# Patient Record
Sex: Female | Born: 1960 | Race: White | Hispanic: No | Marital: Single | Smoking: Former smoker
Health system: Southern US, Community
[De-identification: ages and names within clinical notes are randomized; demographics above are authoritative.]

## PROBLEM LIST (undated history)

## (undated) DIAGNOSIS — G35 Multiple sclerosis: Secondary | ICD-10-CM

## (undated) DIAGNOSIS — M419 Scoliosis, unspecified: Secondary | ICD-10-CM

## (undated) DIAGNOSIS — G35D Multiple sclerosis, unspecified: Secondary | ICD-10-CM

## (undated) DIAGNOSIS — L409 Psoriasis, unspecified: Secondary | ICD-10-CM

## (undated) HISTORY — DX: Scoliosis, unspecified: M41.9

## (undated) HISTORY — PX: THYROID SURGERY: SHX805

## (undated) HISTORY — PX: FINGER GANGLION CYST EXCISION: SHX1636

## (undated) HISTORY — PX: AUGMENTATION MAMMAPLASTY: SUR837

## (undated) HISTORY — PX: REPLACEMENT TOTAL KNEE: SUR1224

## (undated) HISTORY — DX: Psoriasis, unspecified: L40.9

## (undated) HISTORY — DX: Multiple sclerosis: G35

## (undated) HISTORY — PX: FOOT SURGERY: SHX648

## (undated) HISTORY — DX: Multiple sclerosis, unspecified: G35.D

---
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
## 1994-06-02 HISTORY — PX: REFRACTIVE SURGERY: SHX103

## 2006-07-05 HISTORY — PX: OTHER SURGICAL HISTORY: SHX169

## 2014-11-16 ENCOUNTER — Encounter: Payer: Self-pay | Admitting: Neurology

## 2015-06-28 HISTORY — PX: FINGER SURGERY: SHX640

## 2017-04-19 HISTORY — PX: HEMORRHOIDECTOMY WITH HEMORRHOID BANDING: SHX5633

## 2017-04-27 HISTORY — PX: LUMBAR EPIDURAL INJECTION: SHX1980

## 2018-05-07 ENCOUNTER — Telehealth: Payer: Self-pay | Admitting: *Deleted

## 2018-05-07 ENCOUNTER — Ambulatory Visit (INDEPENDENT_AMBULATORY_CARE_PROVIDER_SITE_OTHER): Payer: 59 | Admitting: Neurology

## 2018-05-07 ENCOUNTER — Encounter: Payer: Self-pay | Admitting: Neurology

## 2018-05-07 VITALS — BP 119/71 | HR 64 | Ht 67.0 in | Wt 196.5 lb

## 2018-05-07 DIAGNOSIS — R269 Unspecified abnormalities of gait and mobility: Secondary | ICD-10-CM

## 2018-05-07 DIAGNOSIS — Z79899 Other long term (current) drug therapy: Secondary | ICD-10-CM | POA: Diagnosis not present

## 2018-05-07 DIAGNOSIS — G35 Multiple sclerosis: Secondary | ICD-10-CM | POA: Diagnosis not present

## 2018-05-07 DIAGNOSIS — R5383 Other fatigue: Secondary | ICD-10-CM

## 2018-05-07 DIAGNOSIS — M21372 Foot drop, left foot: Secondary | ICD-10-CM | POA: Diagnosis not present

## 2018-05-07 DIAGNOSIS — G8111 Spastic hemiplegia affecting right dominant side: Secondary | ICD-10-CM

## 2018-05-07 MED ORDER — ARMODAFINIL 200 MG PO TABS: ORAL_TABLET | ORAL | 1 refills | Status: DC

## 2018-05-07 MED ORDER — TIZANIDINE HCL 4 MG PO CAPS: ORAL_CAPSULE | ORAL | 3 refills | Status: DC

## 2018-05-07 MED ORDER — BACLOFEN 20 MG PO TABS: 20.0000 mg | ORAL_TABLET | Freq: Four times a day (QID) | ORAL | 3 refills | Status: DC

## 2018-05-07 NOTE — Progress Notes (Signed)
[facility]  PATIENT: Meredith Hale DOB: 21-Sep-1960  REFERRING DOCTOR OR PCP: Dr. Merita Norton [facility] neurology) SOURCE: Patient, notes from Dr. Manson Passey, imaging and lab reports   _________________________________   HISTORICAL  CHIEF COMPLAINT:  Chief Complaint  Patient presents with  . New Patient (Initial Visit)    RM 13, alone. Paper referral from Dr. Merita Norton for MS evaluation. She recently moved here with her significant other from [place].   . Eye Problem    Left eye worse than right eye. R 20/20, L 20/40, B20/20  . Multiple Sclerosis    Dx in 2006. Last Ocrevus infusion 11/30/17. Has tried failed: Betaseron (SE-felt like a "train wreck", bones ached), copaxone, tecfidera (tolerated with minimal SE, lymphocyte count dropped and neurologist changed her to Aubagio), Aubagio (tolerated well but switched to Ocrevus per preference).   . Gait Problem    Ambulates with cane in public but not in the house. No falls in last year. She has had falls in the past d/t weakness in legs and fatigue.    HISTORY OF PRESENT ILLNESS:  I had the pleasure of seeing your patient, Meredith Hale, at the MS center at [facility] for neurologic consultation regarding her multiple sclerosis.   She is currently on Ocrevus therapy and had her last infusion 11/30/2017.  She is a 57 year old woman who was diagnosed with MS in 2006.  She was mowing the lawn on a hot day when she felt weak and dizzy.   The next morning she woke up numb form the waist down.   When she did not improve, she saw her doctor.   She had MRIs and a lumbar puncture.   The MRI showed lesions consistent with MS,   She had oligoclonal bands in her CSF.   Initially, she was placed on Betaseron but had side effects causing body aches.  Then she was placed on Copaxone and switched to Tecfidera after having an exacerbation.  She tolerated Tecfidera well but her lymphocyte count dropped and she was switched  to Aubagio.  More recently she switched from Aubagio to Ocrevus to be on a more effective medication.   She tolerates it well and her last infusion was Nov 30, 2017.   Her first infusion was November 2018.      Currently, she has weakness on her left side, leg and arm.   She has left foot drop and has trouble lifting the left leg when she walks.    She also has reduced ability to write (she is left handed)    She also has scoliosis and has had issues with her right foot.   Balance is also off.     She uses a cane.   She has muscle spasticity, worse at night and helped by baclofen 20 mg po qid.    Even on the high dose baclofen she notes spasticity.   She does not feel too sleepy.     Bladder function is fine.    She notes ligh sensitivity.   She never had optic neuritis or diplopia.  She notes a lot of fatigue, much worse in heat, physical and cognitve.   She recently moved from [place] and did better there than here.  She has been on Nuvigil in the past, when she worked, and found a benefit.  She sleeps well most nights but is going through menopause.    She notes occasional forgetfulness but no major problems with cognition.    Mood  is doing well for the most part.      She used to work as a Air cabin crew.       Her last MRI was January 2018.    The brian showed white matter foci, mostly non-specific, but the cervical spinal cord showed at least 5 foci c/w demyelination.     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    She has scoliosis causing some back pain and has been told it has worsened.       REVIEW OF SYSTEMS: Constitutional: No fevers, chills, sweats, or change in appetite.  She has fatigue. Eyes: No visual changes, double vision, eye pain.  There is light sensitivity. Ear, nose and throat: No hearing loss, ear pain, nasal congestion, sore throat.  She has tinnitus. Cardiovascular: No chest pain, palpitations Respiratory: No shortness of breath at rest or with  exertion.   No wheezes GastrointestinaI: No nausea, vomiting, diarrhea, abdominal pain, fecal incontinence Genitourinary: No dysuria, urinary retention or frequency.  No nocturia. Musculoskeletal: No neck pain, back pain.   She has scoliosis.   Integumentary: No rash, pruritus currently though she does have psoriasis and some skin moles Neurological: as above Psychiatric: No depression at this time.  No anxiety Endocrine: No palpitations, diaphoresis, change in appetite, change in weigh or increased thirst Hematologic/Lymphatic: No anemia, purpura, petechiae. Allergic/Immunologic: No itchy/runny eyes, nasal congestion, recent allergic reactions, rashes  ALLERGIES: No Known Allergies  HOME MEDICATIONS:  Current Outpatient Medications:  .  baclofen (LIORESAL) 20 MG tablet, Take 1 tablet (20 mg total) by mouth 4 (four) times daily., Disp: 360 each, Rfl: 3 .  fluocinonide (LIDEX) 0.05 % external solution, Apply 1 application topically as needed., Disp: , Rfl:  .  naproxen (NAPROSYN) 500 MG tablet, Take 500 mg by mouth as needed., Disp: , Rfl:  .  ocrelizumab 600 mg in sodium chloride 0.9 % 500 mL, Inject 600 mg into the vein every 6 (six) months., Disp: , Rfl:  .  Armodafinil 200 MG TABS, One po qAM, Disp: 90 tablet, Rfl: 1 .  tiZANidine (ZANAFLEX) 4 MG capsule, Take 1/2 to 2 pills nightly, Disp: 180 capsule, Rfl: 3  PAST MEDICAL HISTORY: Past Medical History:  Diagnosis Date  . Multiple sclerosis (HCC)   . Psoriasis   . Scoliosis     PAST SURGICAL HISTORY: Past Surgical History:  Procedure Laterality Date  . FOOT SURGERY     right and left foot    FAMILY HISTORY: Family History  Adopted: Yes  Family history unknown: Yes    SOCIAL HISTORY:  Social History   Socioeconomic History  . Marital status: Single    Spouse name: Not on file  . Number of children: 0  . Years of education: Masters  . Highest education level: Not on file  Occupational History  . Occupation:  unemployed  Social Needs  . Financial resource strain: Not on file  . Food insecurity:    Worry: Not on file    Inability: Not on file  . Transportation needs:    Medical: Not on file    Non-medical: Not on file  Tobacco Use  . Smoking status: Current Every Day Smoker    Packs/day: 1.00  . Smokeless tobacco: Never Used  Substance and Sexual Activity  . Alcohol use: Yes    Comment: 1 per week  . Drug use: Never  . Sexual activity: Not on file  Lifestyle  . Physical activity:  Days per week: Not on file    Minutes per session: Not on file  . Stress: Not on file  Relationships  . Social connections:    Talks on phone: Not on file    Gets together: Not on file    Attends religious service: Not on file    Active member of club or organization: Not on file    Attends meetings of clubs or organizations: Not on file    Relationship status: Not on file  . Intimate partner violence:    Fear of current or ex partner: Not on file    Emotionally abused: Not on file    Physically abused: Not on file    Forced sexual activity: Not on file  Other Topics Concern  . Not on file  Social History Narrative   Lives w/ significant other, Meredith Hale   Caffeine use: 2 cups per day   Left handed but transitioning to be right handed d/t being weak in left hand     PHYSICAL EXAM  Vitals:   05/07/18 1329  BP: 119/71  Pulse: 64  Weight: 196 lb 8 oz (89.1 kg)  Height: 5\' 7"  (1.702 m)    Body mass index is 30.78 kg/m.   General: The patient is well-developed and well-nourished and in no acute distress  Eyes:  Funduscopic exam shows normal optic discs and retinal vessels.  Neck: The neck is supple, no carotid bruits are noted.  The neck is nontender.  Cardiovascular: The heart has a regular rate and rhythm with a normal S1 and S2. There were no murmurs, gallops or rubs. Lungs are clear to auscultation.  Skin: Extremities are without significant edema.  Musculoskeletal:  Back  is nontender  Neurologic Exam  Mental status: The patient is alert and oriented x 3 at the time of the examination. The patient has apparent normal recent and remote memory, with an apparently normal attention span and concentration ability.   Speech is normal.  Cranial nerves: Extraocular movements are full. Pupils are equal, round, and reactive to light and accomodation.  Visual fields are full.  Facial symmetry is present. There is good facial sensation to soft touch bilaterally.Facial strength is normal.  Trapezius and sternocleidomastoid strength is normal. No dysarthria is noted.  The tongue is midline, and the patient has symmetric elevation of the soft palate. No obvious hearing deficits are noted.  Motor: Muscle bulk is normal.  There is some increased muscle tone in the left leg.. Strength is  5 / 5 on the right.  Strength is 4+ to 5/5 in the left arm but rapid alternating movements are performed slowly.  Strength is 2/5 in the left iliopsoas, 4/5 in the left quadriceps, 4-/5 in the left hamstrings, 2-/5 in the left foot and ankle.  Sensory: Sensory testing is intact to pinprick, soft touch and vibration sensation in all 4 extremities.  Coordination: Cerebellar testing reveals good finger-nose-finger and heel-to-shin bilaterally.  Gait and station: Station is normal.   She has a severe left foot drop and has to circumnavigate the left leg due to more proximal weakness.  She is unable to do a tandem walk.  The Romberg is borderline..   Reflexes: Deep tendon reflexes are symmetric and increased on the left relative to the right.   Plantar responses are flexor on the right and extensor on the left.    DIAGNOSTIC DATA (LABS, IMAGING, TESTING) - I reviewed patient records, labs, notes, testing and imaging myself where available.  ASSESSMENT AND PLAN  Multiple sclerosis (HCC) - Plan: MR BRAIN W WO CONTRAST, MR CERVICAL SPINE W WO CONTRAST, IgG, IgA, IgM, CBC with  Differential/Platelet, Hepatitis B Core AB, Total  High risk medication use - Plan: IgG, IgA, IgM, CBC with Differential/Platelet, Hepatitis B Core AB, Total  Left foot drop  Gait disturbance  Other fatigue  Right spastic hemiplegia (HCC)   In summary, Maylyn better is a 58 year old woman with multiple sclerosis diagnosed in 2006, currently on Ocrevus as her disease modifying therapy.  Her main problems are right sided spastic hemiplegia and fatigue.  She has moved to this area and we will need to get her approved for Ocrevus therapy locally.  Her next dose should be in about a month.  Reviewing labs from [place], she had hep B surface antigen and hep B surface antibody and TB tests which were fine.  We will check a hep B core antibody.   I will also check IgG/IgM and CBC.  We also need to check an MRI of the brain and cervical spine to determine if she is having any subclinical progression while on Ocrevus and consider a different therapy if this is occurring.  We will get her old MRIs for comparison.  She will continue Nuvigil for her fatigue and I will send in a refill.  Additionally I will start tizanidine at bedtime to help with her nocturnal painful spasticity that affects her sleep.    Due to the combination of her physical impairments and her fatigue, she will have difficulty working.  She will return to see me in 5 to 6 months for regular visit with me but call sooner if there are new or worsening neurologic symptoms.  We will call her with the results of the studies and she will return to the office in about a month for her next infusion.   Cassian Torelli A. Epimenio Foot, MD, PhD, FAAN Certified in Neurology, Clinical Neurophysiology, Sleep Medicine, Pain Medicine and Neuroimaging Director, Multiple Sclerosis Center at [facility]  [facility] [address] [place], [place] [address] [phone]

## 2018-05-07 NOTE — Telephone Encounter (Signed)
Gave completed/signed Ocrevus start form to [facility] here at [facility] to process for pt.

## 2018-05-08 ENCOUNTER — Telehealth: Payer: Self-pay | Admitting: *Deleted

## 2018-05-08 ENCOUNTER — Telehealth: Payer: Self-pay | Admitting: Neurology

## 2018-05-08 LAB — CBC WITH DIFFERENTIAL/PLATELET
BASOS ABS: 0.1 10*3/uL (ref 0.0–0.2)
Basos: 1 %
EOS (ABSOLUTE): 0.1 10*3/uL (ref 0.0–0.4)
Eos: 2 %
HEMOGLOBIN: 12.6 g/dL (ref 11.1–15.9)
Hematocrit: 38 % (ref 34.0–46.6)
IMMATURE GRANS (ABS): 0 10*3/uL (ref 0.0–0.1)
IMMATURE GRANULOCYTES: 0 %
LYMPHS: 8 %
Lymphocytes Absolute: 0.6 10*3/uL — ABNORMAL LOW (ref 0.7–3.1)
MCH: 29.9 pg (ref 26.6–33.0)
MCHC: 33.2 g/dL (ref 31.5–35.7)
MCV: 90 fL (ref 79–97)
MONOCYTES: 12 %
Monocytes Absolute: 0.9 10*3/uL (ref 0.1–0.9)
NEUTROS PCT: 77 %
Neutrophils Absolute: 5.5 10*3/uL (ref 1.4–7.0)
Platelets: 299 10*3/uL (ref 150–450)
RBC: 4.22 x10E6/uL (ref 3.77–5.28)
RDW: 12.6 % (ref 12.3–15.4)
WBC: 7.2 10*3/uL (ref 3.4–10.8)

## 2018-05-08 LAB — IGG, IGA, IGM
IGM (IMMUNOGLOBULIN M), SRM: 102 mg/dL (ref 26–217)
IgA/Immunoglobulin A, Serum: 327 mg/dL (ref 87–352)
IgG (Immunoglobin G), Serum: 1033 mg/dL (ref 700–1600)

## 2018-05-08 LAB — HEPATITIS B CORE ANTIBODY, TOTAL: Hep B Core Total Ab: NEGATIVE

## 2018-05-08 MED ORDER — TIZANIDINE HCL 4 MG PO TABS: ORAL_TABLET | ORAL | 3 refills | Status: DC

## 2018-05-08 NOTE — Telephone Encounter (Signed)
Received form from [facility] asking for PA armodafinil. Completed and waiting on MD signature. Will then fax back.

## 2018-05-08 NOTE — Telephone Encounter (Signed)
Tizanidine rx. changed from capsules to tablets due to cost.  No other changes to rx. made. Escribed to [facility]/fim

## 2018-05-08 NOTE — Telephone Encounter (Signed)
lvm for pt to call back about scheduling mri  °[employer] auth: NPR via [employer] website  °

## 2018-05-09 NOTE — Telephone Encounter (Signed)
Faxed completed PA to [facility]. Waiting on determination.

## 2018-05-10 ENCOUNTER — Telehealth: Payer: Self-pay | Admitting: *Deleted

## 2018-05-10 MED ORDER — TIZANIDINE HCL 4 MG PO TABS: ORAL_TABLET | ORAL | 3 refills | Status: DC

## 2018-05-10 NOTE — Telephone Encounter (Signed)
New Tizanidine rx. escribed to [facility] to clarify rx instructions for Tizanidine.  Instructions changed from take 1/2-2 capsules at hs, to take 1-2 capsules at hs/fim

## 2018-05-13 ENCOUNTER — Telehealth: Payer: Self-pay | Admitting: *Deleted

## 2018-05-13 MED ORDER — MODAFINIL 200 MG PO TABS: 200.0000 mg | ORAL_TABLET | Freq: Every day | ORAL | 1 refills | Status: DC

## 2018-05-13 NOTE — Telephone Encounter (Signed)
-----   Message from Hillis Range, RN sent at 05/13/2018  8:09 AM EDT -----   ----- Message ----- From: Asa Lente, MD Sent: 05/10/2018   2:30 PM EDT To: Magnus Sinning Misenheimer, RN  Please let her know that the lab work looks okay.  Her lymphocytes are mildly reduced but the antibody levels are in the normal range.

## 2018-05-13 NOTE — Telephone Encounter (Signed)
Pt called office back, I took call from phone staff. She received my message. She would like to try to see if insurance will cover modafinil instead. Advised I will ask Dr. Epimenio Foot. I will keep her updated. She verbalized understanding.  She also wants update about getting her ocrevus infusions scheduled. Advised [facility] given her start form last week. Can take a couple weeks or so to get approval from insurance. I will give them her message.

## 2018-05-13 NOTE — Telephone Encounter (Signed)
Called, LVM for pt about results. Gave [facility] phone number if she has further questions. 

## 2018-05-13 NOTE — Telephone Encounter (Signed)
Called, LVM for pt to call about armodafinil. Advised PA denied, insurance will only cover for 3 dx: shift work disorder, OSA, narcolepsy. She has option to use goodrx coupon. We can sent to [facility] and it would be about 70.00. Asked her to call back and discuss.

## 2018-05-13 NOTE — Telephone Encounter (Signed)
Spoke with Dr. Epimenio Foot- he is ok to switch her from armodafinil to modafinil 200mg  tab #90, 1 refill to see if insurance will cover this.

## 2018-05-14 NOTE — Telephone Encounter (Signed)
Fax received from [facility]. Modafinil denied b/c pt. has not tried Amantadine for her MS related fatigue./fim

## 2018-05-14 NOTE — Telephone Encounter (Signed)
Called, LVM for pt asking her to call back tomorrow to discuss.  Advised modafinil not covered. Insurance requiring she try amantadine first. Dr. Epimenio Foot can call in rx for her: amantadine 100mg  BID #60, 5 refills if she is agreeable to this.

## 2018-05-14 NOTE — Telephone Encounter (Addendum)
Submitted PA modafinil on covermymeds. Key: [id number]. Waiting on determination.  "Your information has been submitted to [facility]. To check for an updated outcome later, reopen this PA request from your dashboard. If [facility] has not responded to your request within 24 hours, contact [facility] at [phone]."

## 2018-05-20 NOTE — Telephone Encounter (Signed)
I called patient back. She is working with her insurance to try and get modafinil covered. She would like to hold off switching rx to amantadine. She was advised on how to use goodrx.com to get coupon code to pay out of pocket for modafinil.   She also wants update on getting scheduled for Ocrevus infusion. She states she was approved via insurance. Advised I will check with [facility] and call back to give update. She verbalized understanding.

## 2018-05-20 NOTE — Telephone Encounter (Signed)
Called, LVM for pt to call office. 

## 2018-05-20 NOTE — Telephone Encounter (Signed)
Called, LVM for pt to let her know they are still working on getting her scheduled for Ocrevus infusions. They will call her once they receive an update. Asked her to call back if she has further questions/concerns.

## 2018-05-20 NOTE — Telephone Encounter (Signed)
Checked with [facility]--they are still processing her information.

## 2018-05-20 NOTE — Telephone Encounter (Signed)
Pt returning RN's call.

## 2018-05-29 NOTE — Telephone Encounter (Signed)
Pt called wanting to check on infusion process, RN advised that Tina/Beverly will f/u with the pt to schedule

## 2018-06-25 ENCOUNTER — Ambulatory Visit: Payer: 59

## 2018-06-25 ENCOUNTER — Other Ambulatory Visit: Payer: 59

## 2018-06-25 DIAGNOSIS — G35 Multiple sclerosis: Secondary | ICD-10-CM | POA: Diagnosis not present

## 2018-06-25 MED ORDER — GADOBENATE DIMEGLUMINE 529 MG/ML IV SOLN
14.0000 mL | Freq: Once | INTRAVENOUS | Status: AC | PRN
  Administered 2018-06-25: 14 mL via INTRAVENOUS

## 2018-06-28 ENCOUNTER — Telehealth: Payer: Self-pay | Admitting: *Deleted

## 2018-06-28 NOTE — Telephone Encounter (Signed)
-----   Message from Asa Lente, MD sent at 06/28/2018 12:40 PM EST ----- Please let him know that the MRI of the cervical spine shows several MS plaques.  None of them look to be new or recent.  The MRI of the brain just shows a couple small spots that are more nonspecific.

## 2018-06-28 NOTE — Telephone Encounter (Signed)
Left patient a detailed message, with results, on voicemail (ok per DPR).  Provided our number to call back with any questions.  

## 2018-07-03 ENCOUNTER — Other Ambulatory Visit: Payer: Self-pay | Admitting: Neurology

## 2018-07-03 NOTE — Telephone Encounter (Signed)
I printed this message and gave it to Christoval in the infusion suite.  I am not sure where [facility] gets pt's Ocrevus, but will order it from [facility] if [facility] needs [place] too.  Otherwise, [facility] will order it/fim

## 2018-07-03 NOTE — Telephone Encounter (Signed)
Marianna Fuss with [facility] called stating they are needing a new rx for Ocrevus sent over. Please advise

## 2018-08-16 ENCOUNTER — Encounter: Payer: Self-pay | Admitting: Podiatry

## 2018-08-16 ENCOUNTER — Ambulatory Visit (INDEPENDENT_AMBULATORY_CARE_PROVIDER_SITE_OTHER): Payer: 59 | Admitting: Podiatry

## 2018-08-16 ENCOUNTER — Ambulatory Visit (INDEPENDENT_AMBULATORY_CARE_PROVIDER_SITE_OTHER): Payer: 59

## 2018-08-16 VITALS — BP 115/66 | HR 85 | Resp 16

## 2018-08-16 DIAGNOSIS — M779 Enthesopathy, unspecified: Secondary | ICD-10-CM | POA: Diagnosis not present

## 2018-08-16 DIAGNOSIS — Z01419 Encounter for gynecological examination (general) (routine) without abnormal findings: Secondary | ICD-10-CM | POA: Insufficient documentation

## 2018-08-16 DIAGNOSIS — M7750 Other enthesopathy of unspecified foot: Secondary | ICD-10-CM

## 2018-08-16 DIAGNOSIS — M722 Plantar fascial fibromatosis: Secondary | ICD-10-CM | POA: Diagnosis not present

## 2018-08-16 DIAGNOSIS — M7751 Other enthesopathy of right foot: Secondary | ICD-10-CM | POA: Diagnosis not present

## 2018-08-16 MED ORDER — TRIAMCINOLONE ACETONIDE 10 MG/ML IJ SUSP
10.0000 mg | Freq: Once | INTRAMUSCULAR | Status: AC
  Administered 2018-08-16: 10 mg

## 2018-08-16 NOTE — Progress Notes (Signed)
   Subjective:    Patient ID: Meredith Hale, female    DOB: 1960-09-27, 58 y.o.   MRN: [id number]  HPI    Review of Systems  All other systems reviewed and are negative.      Objective:   Physical Exam        Assessment & Plan:

## 2018-08-16 NOTE — Progress Notes (Signed)
Subjective:   Patient ID: Meredith Hale, female   DOB: 58 y.o.   MRN: [id number]   HPI Patient presents stating she is had foot surgery on both feet with ankle surgery in 2007 right and foot surgery afterwards 2012.  Patient has MS which is contributing factor and is developed foot drop left and severe flatfoot deformity right and does wear bracing that is older and probably needs to be replaced.  Patient is not walking properly and does utilize a cane and has most of her pain in the outside of the right ankle now.  Patient smokes a pack per day and would like to be more active   Review of Systems  All other systems reviewed and are negative.       Objective:  Physical Exam Vitals signs and nursing note reviewed.  Constitutional:      Appearance: She is well-developed.  Pulmonary:     Effort: Pulmonary effort is normal.  Musculoskeletal: Normal range of motion.  Skin:    General: Skin is warm.  Neurological:     Mental Status: She is alert.     Neurovascular status intact muscle strength is adequate range of motion is within normal limits.  Patient does have severe flatfoot deformity right with dysfunction of posterior tibial tendon indications of talonavicular pathology and exquisite discomfort in the sinus tarsi.  Left shows complete lack of function of the extensor tendon group and also the anterior tibial tendon and patient does have brace which is moderately successful in helping her symptoms.  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  Today I met a focus on the acute inflammation I injected the sinus tarsi right 3 mg Kenalog 5 mg Xylocaine and advised on bracing and I am going to have her see  ped orthotist for consideration of replacing her old braces with newer technology which may be of better benefit to her.  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

## 2018-08-17 ENCOUNTER — Ambulatory Visit: Payer: 59 | Admitting: Podiatry

## 2018-08-19 ENCOUNTER — Ambulatory Visit (INDEPENDENT_AMBULATORY_CARE_PROVIDER_SITE_OTHER): Payer: 59 | Admitting: Orthotics

## 2018-08-19 DIAGNOSIS — M779 Enthesopathy, unspecified: Secondary | ICD-10-CM | POA: Diagnosis not present

## 2018-08-19 DIAGNOSIS — M722 Plantar fascial fibromatosis: Secondary | ICD-10-CM

## 2018-08-19 DIAGNOSIS — M7750 Other enthesopathy of unspecified foot: Secondary | ICD-10-CM

## 2018-08-19 NOTE — Progress Notes (Signed)
Patient presents with MS, tendonitis, PTTD, RF valgus deformity, sinus tarsi syn.   Upon examination, patient is about 5* EVERTED on the RF and is a lot of discomfort, particularly in sinus tarsi region.   Showed her different brace options: mezzo, richie, az gauntlet, plastic afo.   Due to financial considerations, as well as wanting a low profile brace, she really would like to try a Richie inverted type of foot orthotic.  Cast her for [id number]

## 2018-09-05 ENCOUNTER — Telehealth: Payer: Self-pay | Admitting: Neurology

## 2018-09-05 MED ORDER — BACLOFEN 20 MG PO TABS: 20.0000 mg | ORAL_TABLET | Freq: Four times a day (QID) | ORAL | 3 refills | Status: DC

## 2018-09-05 NOTE — Telephone Encounter (Signed)
E-scribed rx to [facility] as requested.

## 2018-09-05 NOTE — Telephone Encounter (Signed)
Pt is needing her 90 day supply of her baclofen (LIORESAL) 20 MG tablet sent to [facility] [phone] Pt states she will use GoodRx and not the insurance because it would be half the cost for her. Please advise.

## 2018-09-06 ENCOUNTER — Ambulatory Visit (INDEPENDENT_AMBULATORY_CARE_PROVIDER_SITE_OTHER): Payer: 59 | Admitting: Orthotics

## 2018-09-06 DIAGNOSIS — M779 Enthesopathy, unspecified: Secondary | ICD-10-CM

## 2018-09-06 DIAGNOSIS — M722 Plantar fascial fibromatosis: Secondary | ICD-10-CM | POA: Diagnosis not present

## 2018-09-06 DIAGNOSIS — M7750 Other enthesopathy of unspecified foot: Secondary | ICD-10-CM

## 2018-09-06 NOTE — Progress Notes (Addendum)
Patient picked up f/o; however, adjustments needed to be made to address her serious RF valgus deformity.  More varus posting was added to both RF and FF.  Patient was advised that IF this wasn't going to be enough; then we need to remake the f/o to more of a UCBL type device OR Az mezzo brace.  On the LEFT foot she has an Ottobock OTS brace; she is trying a Trulife brace with anterior interface; she seems to be having more stability and lift with this brace; she will decide if she wants me to order her one.   Patient wants the Trulife brace.  Delivered.

## 2018-10-01 ENCOUNTER — Other Ambulatory Visit: Payer: 59 | Admitting: Orthotics

## 2018-10-08 ENCOUNTER — Other Ambulatory Visit: Payer: 59 | Admitting: Orthotics

## 2018-11-06 ENCOUNTER — Ambulatory Visit: Payer: 59 | Admitting: Neurology

## 2018-11-26 ENCOUNTER — Telehealth: Payer: Self-pay | Admitting: Osteopathic Medicine

## 2018-11-26 ENCOUNTER — Other Ambulatory Visit: Payer: Self-pay | Admitting: *Deleted

## 2018-11-26 DIAGNOSIS — Z1231 Encounter for screening mammogram for malignant neoplasm of breast: Secondary | ICD-10-CM

## 2018-11-26 NOTE — Telephone Encounter (Signed)
Placed new patient paperwork in the mail for patient. 11/26/2018.

## 2018-12-05 ENCOUNTER — Ambulatory Visit (INDEPENDENT_AMBULATORY_CARE_PROVIDER_SITE_OTHER): Payer: 59

## 2018-12-05 ENCOUNTER — Other Ambulatory Visit: Payer: Self-pay

## 2018-12-05 DIAGNOSIS — Z1231 Encounter for screening mammogram for malignant neoplasm of breast: Secondary | ICD-10-CM | POA: Diagnosis not present

## 2018-12-11 ENCOUNTER — Ambulatory Visit: Payer: 59 | Admitting: Podiatry

## 2018-12-12 ENCOUNTER — Other Ambulatory Visit: Payer: Self-pay

## 2018-12-12 ENCOUNTER — Ambulatory Visit (INDEPENDENT_AMBULATORY_CARE_PROVIDER_SITE_OTHER): Payer: 59 | Admitting: Osteopathic Medicine

## 2018-12-12 ENCOUNTER — Encounter: Payer: Self-pay | Admitting: Osteopathic Medicine

## 2018-12-12 ENCOUNTER — Ambulatory Visit (INDEPENDENT_AMBULATORY_CARE_PROVIDER_SITE_OTHER): Payer: 59 | Admitting: Podiatry

## 2018-12-12 ENCOUNTER — Encounter: Payer: Self-pay | Admitting: Podiatry

## 2018-12-12 VITALS — Temp 97.9°F

## 2018-12-12 VITALS — BP 131/74 | HR 75 | Temp 98.1°F | Ht 67.0 in | Wt 213.3 lb

## 2018-12-12 DIAGNOSIS — M779 Enthesopathy, unspecified: Secondary | ICD-10-CM

## 2018-12-12 DIAGNOSIS — G35 Multiple sclerosis: Secondary | ICD-10-CM

## 2018-12-12 DIAGNOSIS — Z975 Presence of (intrauterine) contraceptive device: Secondary | ICD-10-CM | POA: Diagnosis not present

## 2018-12-12 DIAGNOSIS — L409 Psoriasis, unspecified: Secondary | ICD-10-CM | POA: Diagnosis not present

## 2018-12-12 DIAGNOSIS — M7751 Other enthesopathy of right foot: Secondary | ICD-10-CM | POA: Diagnosis not present

## 2018-12-12 DIAGNOSIS — M7750 Other enthesopathy of unspecified foot: Secondary | ICD-10-CM

## 2018-12-12 MED ORDER — TRIAMCINOLONE ACETONIDE 10 MG/ML IJ SUSP
10.0000 mg | Freq: Once | INTRAMUSCULAR | Status: AC
  Administered 2018-12-12: 10 mg

## 2018-12-12 NOTE — Progress Notes (Signed)
HPI: Meredith Hale is a 58 y.o. female who  has a past medical history of Multiple sclerosis (HCC), Psoriasis, and Scoliosis.  she presents to [facility] today, 12/12/18,  for chief complaint of: New to establish care   Very pleasant patient here to establish care.  Moved to the area a while ago from [place] with her significant other.  Multiple sclerosis: She is already established with a neurologist in the area.  No recent flares and patient is stable on current treatment.  GERD: Well-controlled on current treatment Psoriasis: Well-controlled on current treatment  New concern: Left ear fullness present for the past couple of weeks.  Patient brings printed materials outlining previous preventive care measures.  Colonoscopy April 04, 2017, advise repeat colonoscopy in 5 years for surveillance. Dr Dorathy Kinsman in [employer] [employer], [phone].  Mammogram May 2020 at [facility], results pending.  Pap smear April 16, 2017 was reportedly normal, Dr. Royden Purl in [facility]  Past medical, surgical, social and family history reviewed:  Patient Active Problem List   Diagnosis Date Noted  . Normal gynecologic examination 08/16/2018    Past Surgical History:  Procedure Laterality Date  . AUGMENTATION MAMMAPLASTY    . FOOT SURGERY     right and left foot    Social History   Tobacco Use  . Smoking status: Former Smoker    Packs/day: 1.00  . Smokeless tobacco: Never Used  Substance Use Topics  . Alcohol use: Yes    Comment: 1 per week    Family History  Adopted: Yes  Family history unknown: Yes     Current medication list and allergy/intolerance information reviewed:    Current Outpatient Medications  Medication Sig Dispense Refill  . baclofen (LIORESAL) 20 MG tablet Take 1 tablet (20 mg total) by mouth 4 (four) times daily. 360 each 3  . Biotin 1000 MCG tablet Take 1,000 mcg by mouth 3 (three) times daily.     Marland Kitchen CALCIUM-VITAMIN D PO Take by mouth.    . Cholecalciferol (D3-1000) 25 MCG (1000 UT) capsule Take 1,000 Units by mouth daily.    . clobetasol ointment (TEMOVATE) 0.05 % Apply 1 application topically 2 (two) times daily.    . Clobetasol Propionate (CLOBEX SPRAY) 0.05 % external spray Clobex 0.05 % topical spray    . fluocinonide (LIDEX) 0.05 % external solution Apply 1 application topically as needed.    . Multiple Vitamins-Minerals (MULTIVITAL) tablet Take 1 tablet by mouth daily.    . naproxen (NAPROSYN) 500 MG tablet Take 500 mg by mouth as needed.    Marland Kitchen ocrelizumab 600 mg in sodium chloride 0.9 % 500 mL Inject 600 mg into the vein every 6 (six) months.    . Omega-3 Fatty Acids (FISH OIL) 1000 MG CAPS Take by mouth.    Marland Kitchen omeprazole (PRILOSEC) 40 MG capsule Take 40 mg by mouth daily.    . Probiotic Product (ALIGN PO) Take by mouth.    Marland Kitchen tiZANidine (ZANAFLEX) 4 MG tablet Take 1 to 2 capsules nightly (Patient taking differently: 2 (two) times daily. Take 1 to 2 capsules nightly) 180 tablet 3   No current facility-administered medications for this visit.     No Known Allergies    Review of Systems:  Constitutional:  No  fever, no chills, No recent illness, No unintentional weight changes. No significant fatigue.   HEENT: No  headache, no vision change, no hearing change, No sore throat, No  sinus pressure  Cardiac:  No  chest pain, No  pressure, No palpitations, No  Orthopnea  Respiratory:  No  shortness of breath. No  Cough  Gastrointestinal: No  abdominal pain, No  nausea, No  vomiting,  No  blood in stool, No  diarrhea, No  constipation   Musculoskeletal: No new myalgia/arthralgia  Skin: No  Rash, No other wounds/concerning lesions  Genitourinary: No  incontinence, No  abnormal genital bleeding, No abnormal genital discharge  Hem/Onc: No  easy bruising/bleeding, No  abnormal lymph node  Endocrine: No cold intolerance,  No heat intolerance. No polyuria/polydipsia/polyphagia    Neurologic: +chronic L foot drop d/t MS, no other weakness, No  dizziness, No  slurred speech/focal weakness/facial droop  Psychiatric: No  concerns with depression, No  concerns with anxiety, No sleep problems, No mood problems  Exam:  BP 131/74 (BP Location: Left Arm, Patient Position: Sitting, Cuff Size: Large)   Pulse 75   Temp 98.1 F (36.7 C) (Oral)   Ht  (1.702 m)   Wt 213 lb 4.8 oz (96.8 kg)   LMP 09/06/2018   BMI 33.41 kg/m   Constitutional: VS see above. General Appearance: alert, well-developed, well-nourished, NAD  Eyes: Normal lids and conjunctive, non-icteric sclera  Ears, Nose, Mouth, Throat: MMM, Normal external inspection ears/nares/mouth/lips/gums. TM normal bilaterally. Pharynx/tonsils no erythema, no exudate. Nasal mucosa normal.   Neck: No masses, trachea midline. No thyroid enlargement. No tenderness/mass appreciated. No lymphadenopathy  Respiratory: Normal respiratory effort. no wheeze, no rhonchi, no rales  Cardiovascular: S1/S2 normal, no murmur, no rub/gallop auscultated. RRR. No lower extremity edema.   Gastrointestinal: Nontender, no masses. No hepatomegaly, no splenomegaly. No hernia appreciated. Bowel sounds normal. Rectal exam deferred.   Musculoskeletal: Gait normal. No clubbing/cyanosis of digits.   Neurological: Normal balance/coordination. No tremor.   Skin: warm, dry, intact. No rash/ulcer.  Psychiatric: Normal judgment/insight. Normal mood and affect. Oriented x3.      ASSESSMENT/PLAN: The primary encounter diagnosis was MS (multiple sclerosis) (HCC). Diagnoses of Psoriasis and IUD (intrauterine device) in place were also pertinent to this visit.  Doing well on current medications Will request records from previous PCP/routine physicals.   Orders Placed This Encounter  Procedures  . CBC  . COMPLETE METABOLIC PANEL WITH GFR  . Lipid panel  . TSH    No orders of the defined types were placed in this  encounter.   Patient Instructions  General Preventive Care  Most recent routine screening lipids/other labs: ordered today.   Everyone should have blood pressure checked once per year.   Tobacco: don't! Please let me know if you need help quitting!  Alcohol: responsible moderation is ok for most adults - if you have concerns about your alcohol intake, please talk to me!   Exercise: as tolerated to reduce risk of cardiovascular disease and diabetes. Strength training will also prevent osteoporosis.   Mental health: if need for mental health care (medicines, counseling, other), or concerns about moods, please let me know!   Sexual health: if need for STD testing, or if concerns with libido/pain problems, please let me know! If you need to discuss your birth control options, please let me know!   Advanced Directive: Living Will and/or Healthcare Power of Attorney recommended for all adults, regardless of age or health.  Vaccines  Flu vaccine: recommended for almost everyone, every fall.   Shingles vaccine: Shingrix recommended after age 87  Pneumonia vaccines: Prevnar and Pneumovax recommended after age 93, or sooner if certain medical conditions.  Tetanus  booster: Tdap recommended every 10 years.  Cancer screenings   Colon cancer screening: when due   Breast cancer screening: annually   Cervical cancer screening: Pap every 1 to 5 years depending on age and other risk factors. Can usually stop at age 12 or w/ hysterectomy.   Lung cancer screening: not needed if quit smoking >15 years ago  Infection screenings . HIV, Gonorrhea/Chlamydia: screening as needed . Hepatitis C: recommended for anyone born 46-1963 . TB: certain at-risk populations, or depending on work requirements and/or travel history Other . Bone Density Test: recommended for women at age 29            Visit summary with medication list and pertinent instructions was printed for patient to review.  All questions at time of visit were answered - patient instructed to contact office with any additional concerns or updates. ER/RTC precautions were reviewed with the patient.     Please note: voice recognition software was used to produce this document, and typos may escape review. Please contact Dr. Lyn Hollingshead for any needed clarifications.     Follow-up plan: Return in about 6 months (around 06/14/2019) for annual physical - sooner if needed .

## 2018-12-12 NOTE — Patient Instructions (Signed)
General Preventive Care  Most recent routine screening lipids/other labs: ordered today.   Everyone should have blood pressure checked once per year.   Tobacco: don't! Please let me know if you need help quitting!  Alcohol: responsible moderation is ok for most adults - if you have concerns about your alcohol intake, please talk to me!   Exercise: as tolerated to reduce risk of cardiovascular disease and diabetes. Strength training will also prevent osteoporosis.   Mental health: if need for mental health care (medicines, counseling, other), or concerns about moods, please let me know!   Sexual health: if need for STD testing, or if concerns with libido/pain problems, please let me know! If you need to discuss your birth control options, please let me know!   Advanced Directive: Living Will and/or Healthcare Power of Attorney recommended for all adults, regardless of age or health.  Vaccines  Flu vaccine: recommended for almost everyone, every fall.   Shingles vaccine: Shingrix recommended after age 22  Pneumonia vaccines: Prevnar and Pneumovax recommended after age 37, or sooner if certain medical conditions.  Tetanus booster: Tdap recommended every 10 years.  Cancer screenings   Colon cancer screening: when due   Breast cancer screening: annually   Cervical cancer screening: Pap every 1 to 5 years depending on age and other risk factors. Can usually stop at age 36 or w/ hysterectomy.   Lung cancer screening: not needed if quit smoking >15 years ago  Infection screenings . HIV, Gonorrhea/Chlamydia: screening as needed . Hepatitis C: recommended for anyone born 06-1962 . TB: certain at-risk populations, or depending on work requirements and/or travel history Other . Bone Density Test: recommended for women at age 60

## 2018-12-12 NOTE — Progress Notes (Signed)
Subjective:   Patient ID: Meredith Hale, female   DOB: 58 y.o.   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  Reappoint for [place] to recheck

## 2018-12-12 NOTE — Addendum Note (Signed)
Addended by: Ria Clock on: 12/12/2018 01:13 PM   Modules accepted: Level of Service

## 2019-01-14 ENCOUNTER — Telehealth: Payer: Self-pay

## 2019-01-14 NOTE — Telephone Encounter (Signed)
Pt called requesting stating that she would like to be referred to - dermatologist, ENT and physician for back pain. As per pt, she does not need actual referrals, just names/location. She is new to the area and wants to make appts with the requested specialist. If possible, pt request info to be emailed to her at: [email or web address]. Pls advise, thanks.

## 2019-01-14 NOTE — Telephone Encounter (Signed)
I have no preference or any strong recommendation for where she would like to go.  Would recommend she contact her insurer for a list of local providers.

## 2019-01-14 NOTE — Telephone Encounter (Signed)
Pt has been updated of provider's recommendation. She will contact her insurer for a list of providers. No other inquiries during call.

## 2019-01-20 ENCOUNTER — Telehealth: Payer: Self-pay

## 2019-01-20 NOTE — Telephone Encounter (Signed)
Pt called to schedule appt with Dr. Felecia Shelling to discuss filing for disability and recent insurance changes.  Pt has been scheduled for 01/21/19 at 11am check in time 1030 and will bring mask along with updated insurance cards.

## 2019-01-21 ENCOUNTER — Ambulatory Visit: Payer: BLUE CROSS/BLUE SHIELD | Admitting: Neurology

## 2019-01-21 ENCOUNTER — Other Ambulatory Visit: Payer: Self-pay

## 2019-01-21 ENCOUNTER — Telehealth: Payer: Self-pay | Admitting: Neurology

## 2019-01-21 ENCOUNTER — Encounter: Payer: Self-pay | Admitting: Neurology

## 2019-01-21 VITALS — BP 124/75 | HR 66 | Temp 98.0°F | Ht 67.0 in | Wt 215.0 lb

## 2019-01-21 DIAGNOSIS — R3911 Hesitancy of micturition: Secondary | ICD-10-CM

## 2019-01-21 DIAGNOSIS — R5383 Other fatigue: Secondary | ICD-10-CM | POA: Diagnosis not present

## 2019-01-21 DIAGNOSIS — E559 Vitamin D deficiency, unspecified: Secondary | ICD-10-CM | POA: Diagnosis not present

## 2019-01-21 DIAGNOSIS — Z79899 Other long term (current) drug therapy: Secondary | ICD-10-CM

## 2019-01-21 DIAGNOSIS — R42 Dizziness and giddiness: Secondary | ICD-10-CM | POA: Diagnosis not present

## 2019-01-21 DIAGNOSIS — G8114 Spastic hemiplegia affecting left nondominant side: Secondary | ICD-10-CM | POA: Diagnosis not present

## 2019-01-21 DIAGNOSIS — R29898 Other symptoms and signs involving the musculoskeletal system: Secondary | ICD-10-CM

## 2019-01-21 DIAGNOSIS — G35 Multiple sclerosis: Secondary | ICD-10-CM

## 2019-01-21 DIAGNOSIS — H6993 Unspecified Eustachian tube disorder, bilateral: Secondary | ICD-10-CM | POA: Diagnosis not present

## 2019-01-21 MED ORDER — TAMSULOSIN HCL 0.4 MG PO CAPS: 0.4000 mg | ORAL_CAPSULE | Freq: Every day | ORAL | 5 refills | Status: DC

## 2019-01-21 NOTE — Telephone Encounter (Addendum)
Pt is asking for a call from RN to discuss her Ocrevus and it not being covered under her new insurance.  Pt also wants a call to discuss the best route re: baclofen (LIORESAL) 20 MG tablet and tiZANidine (ZANAFLEX). Pt states that a 90 day is needed and she wants to know if it should go thru [facility] or mail order

## 2019-01-21 NOTE — Telephone Encounter (Signed)
Called pt back. She has new insurance. I printed cards scanned into chart today and gave to infusion suite letting them know pt has new insurance. Last infusion end of May and next infusion scheduled for 05/28/19 per pt. She is going to call [facility] order to see price difference and call back if she would like rx's to go to mail order instead. Nothing further needed.

## 2019-01-21 NOTE — Progress Notes (Signed)
[facility]  PATIENT: Meredith Hale DOB: 08/27/1960  REFERRING DOCTOR OR PCP: Dr. Merita Nortonound [facility]([place] [facility]) SOURCE: Patient, notes from Dr. Manson PasseyBrown, imaging and lab reports   _________________________________   HISTORICAL  CHIEF COMPLAINT:  Chief Complaint  Patient presents with  . Follow-up    RM 13, alone. Last seen 10.22.19. Wants to discuss filing for disability. Been out of work for a year.    . Multiple Sclerosis    On Ocrevus. Last infusion: 12/11/2018. Next infusion: 05/28/19  . Fall    No falls since last seen. Has had several near falls. Ambulates with quad cane. Has brace on left lower leg.    HISTORY OF PRESENT ILLNESS:  Meredith Hale is a 58 year old woman with MS diagnosed in 2006.  Update 01/21/2019: She has been mostly stable with no new exacerbations.   She is on Ocrevus and tolerates it well.   She is currently on Ocrevus therapy and had her last infusion 11/30/2017.  She has been using a cane x 2 years due to her left leg weakness, spasticity and clumsiness.  She notes milder right leg symptoms and has pronation of the right foot.   She wears braces on both legs.   She has left arm weakness as well.   She is left handed and writing and other skills are worse.   She also has spasms in her legs and sees some improvement with tizanidine and baclofen.    She has urinary hesitancy, reduced urinary stream and is not sure that she empties.   However, she has had only one UTI.      She has fatigue.   She feels cognition is a little off with some forgetfulness and reduced focus.  She might have mild depression.   She is more sleepy than she used to be.   She does not snore.   She does not have insomnia.  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   She is not working now due to her fatigue.        From 05/07/2018  She is a 58 year old woman who was diagnosed with MS in 2006.  She was  mowing the lawn on a hot day when she felt weak and dizzy.   The next morning she woke up numb form the waist down.   When she did not improve, she saw her doctor.   She had MRIs and a lumbar puncture.   The MRI showed lesions consistent with MS,   She had oligoclonal bands in her CSF.   Initially, she was placed on Betaseron but had side effects causing body aches.  Then she was placed on Copaxone and switched to Tecfidera after having an exacerbation.  She tolerated Tecfidera well but her lymphocyte count dropped and she was switched to Aubagio.  More recently she switched from Aubagio to Ocrevus to be on a more effective medication.   She tolerates it well and her last infusion was Nov 30, 2017.   Her first infusion was November 2018.      Currently, she has weakness on her left side, leg and arm.   She has left foot drop and has trouble lifting the left leg when she walks.    She also has reduced ability to write (she is left handed)    She also has scoliosis and has had issues with her right foot.   Balance is  also off.     She uses a cane.   She has muscle spasticity, worse at night and helped by baclofen 20 mg po qid.    Even on the high dose baclofen she notes spasticity.   She does not feel too sleepy.     Bladder function is fine.    She notes ligh sensitivity.   She never had optic neuritis or diplopia.  She notes a lot of fatigue, much worse in heat, physical and cognitve.   [place] moved from [place] and did better there than here.  She has been on Nuvigil in the past, when she worked, and found a benefit.  She sleeps well most nights but is going through menopause.    She notes occasional forgetfulness but no major problems with cognition.    Mood is doing well for the most part.      She used to work as a Air cabin crewbusiness analyst.       Her last MRI was January 2018.    The brian showed white matter foci, mostly non-specific, but the cervical spinal cord showed at least 5 foci c/w demyelination.      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    She has scoliosis causing some back pain and has been told it has worsened.       REVIEW OF SYSTEMS: Constitutional: No fevers, chills, sweats, or change in appetite.  She has fatigue. Eyes: No visual changes, double vision, eye pain.  There is light sensitivity. Ear, nose and throat: No hearing loss, ear pain, nasal congestion, sore throat.  She has tinnitus. Cardiovascular: No chest pain, palpitations Respiratory: No shortness of breath at rest or with exertion.   No wheezes GastrointestinaI: No nausea, vomiting, diarrhea, abdominal pain, fecal incontinence Genitourinary: No dysuria, urinary retention or frequency.  No nocturia. Musculoskeletal: No neck pain, back pain.   She has scoliosis.   Integumentary: No rash, pruritus currently though she does have psoriasis and some skin moles Neurological: as above Psychiatric: No depression at this time.  No anxiety Endocrine: No palpitations, diaphoresis, change in appetite, change in weigh or increased thirst Hematologic/Lymphatic: No anemia, purpura, petechiae. Allergic/Immunologic: No itchy/runny eyes, nasal congestion, recent allergic reactions, rashes  ALLERGIES: No Known Allergies  HOME MEDICATIONS:  Current Outpatient Medications:  .  baclofen (LIORESAL) 20 MG tablet, Take 1 tablet (20 mg total) by mouth 4 (four) times daily., Disp: 360 each, Rfl: 3 .  Biotin 1000 MCG tablet, Take 1,000 mcg by mouth 3 (three) times daily., Disp: , Rfl:  .  CALCIUM-VITAMIN D PO, Take by mouth., Disp: , Rfl:  .  Cholecalciferol (D3-1000) 25 MCG (1000 UT) capsule, Take 1,000 Units by mouth daily., Disp: , Rfl:  .  clobetasol ointment (TEMOVATE) 0.05 %, Apply 1 application topically 2 (two) times daily., Disp: , Rfl:  .  Clobetasol Propionate (CLOBEX SPRAY) 0.05 % external spray, Clobex 0.05 % topical spray, Disp: , Rfl:  .  fluocinonide (LIDEX) 0.05 % external solution, Apply  1 application topically as needed., Disp: , Rfl:  .  Multiple Vitamins-Minerals (MULTIVITAL) tablet, Take 1 tablet by mouth daily., Disp: , Rfl:  .  naproxen (NAPROSYN) 500 MG tablet, Take 500 mg by mouth as needed., Disp: , Rfl:  .  ocrelizumab 600 mg in sodium chloride 0.9 % 500 mL, Inject 600 mg into the vein every 6 (six) months., Disp: , Rfl:  .  Omega-3 Fatty Acids (FISH OIL) 1000 MG  CAPS, Take by mouth., Disp: , Rfl:  .  omeprazole (PRILOSEC) 40 MG capsule, Take 40 mg by mouth daily., Disp: , Rfl:  .  Probiotic Product (ALIGN PO), Take by mouth., Disp: , Rfl:  .  tiZANidine (ZANAFLEX) 4 MG tablet, Take 1 to 2 capsules nightly (Patient taking differently: 2 (two) times daily. Take 1 to 2 capsules nightly), Disp: 180 tablet, Rfl: 3  PAST MEDICAL HISTORY: Past Medical History:  Diagnosis Date  . Multiple sclerosis (HCC)   . Psoriasis   . Scoliosis     PAST SURGICAL HISTORY: Past Surgical History:  Procedure Laterality Date  . AUGMENTATION MAMMAPLASTY    . bone navicular  07/05/2006  . FINGER GANGLION CYST EXCISION    . FINGER SURGERY  06/28/2015   capsular release due to injury  . FOOT SURGERY     right and left foot  . FOOT SURGERY    . HEMORRHOIDECTOMY WITH HEMORRHOID BANDING  04/19/2017  . LUMBAR EPIDURAL INJECTION  04/27/2017  . lymph node removal  1976  . REFRACTIVE SURGERY  06/02/1994  . 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 Marital status: Single    Spouse name: Not on file  . Number of children: 0  . Years of education: Masters  . Highest education level: Not on file  Occupational History  . Occupation: unemployed  Social Needs  . Financial resource strain: Not on file  . Food insecurity    Worry: Not on file    Inability: Not on file  . Transportation needs     Medical: Not on file    Non-medical: Not on file  Tobacco Use  . Smoking status: Former Smoker    Packs/day: 1.00  . Smokeless tobacco: Never Used  Substance and Sexual Activity  . Alcohol use: Yes    Comment: 1 per week  . Drug use: Never  . Sexual activity: Yes    Partners: Male  Lifestyle  . Physical activity    Days per week: Not on file    Minutes per session: Not on file  . Stress: Not on file  Relationships  . Social Musicianconnections    Talks on phone: Not on file    Gets together: Not on file    Attends religious service: Not on file    Active member of club or organization: Not on file    Attends meetings of clubs or organizations: Not on file    Relationship status: Not on file  . Intimate partner violence    Fear of current or ex partner: Not on file    Emotionally abused: Not on file    Physically abused: Not on file    Forced sexual activity: Not on file  Other Topics Concern  . Not on file  Social History Narrative   Lives w/ significant other, Sharen HintKevin Fricke   Caffeine use: 2 cups per day   Left handed but transitioning to be right handed d/t being weak in left hand     PHYSICAL EXAM  Vitals:   01/21/19 1112  BP: 124/75  Pulse: 66  Temp: 98 F (36.7 C)  SpO2: 97%  Weight: 215 lb (97.5 kg)  Height: 5\' 7"  (1.702 m)    Body mass index is 33.67 kg/m.  General: The patient is well-developed and well-nourished and in no acute distress   Neck: The neck is supple, no carotid bruits are noted.  The neck is nontender.   Skin: Extremities are without rash or edema.   Neurologic Exam  Mental status: The patient is alert and oriented x 3 at the time of the examination. The patient has apparent normal recent and remote memory, with an apparently normal attention span and concentration ability.   Speech is normal.  Cranial nerves: Extraocular movements are full. Pupils are equal, round, and reactive to light and accomodation.  Visual fields are full.   Facial symmetry is present. There is good facial sensation to soft touch bilaterally.Facial strength is normal.  Trapezius and sternocleidomastoid strength is normal. No dysarthria is noted.  The tongue is midline, and the patient has symmetric elevation of the soft palate. No obvious hearing deficits are noted.  Motor:  There is some increased muscle tone in the left leg.. Strength is  5 / 5 on the right.  Strength is 4/5 in the left arm except 2+/5 in the intrinsic hand muscles.  She has atrophy of the intrinsic hand muscles..  Strength is 2/5 in the left iliopsoas, 4/5 in the left quadriceps, 4-/5 in the left hamstrings, 2-/5 in the left foot and ankle.  Sensory: She has intact sensation to touch and vibration.  Coordination: Cerebellar testing reveals good finger-nose-finger and heel-to-shin bilaterally.  Gait and station: Station is normal.   She has bilateral AFO braces.  Very poor gait due to the left weakness..  She is unable to do a tandem walk.  The Romberg is borderline..   Reflexes: Deep tendon reflexes are symmetric and increased on the left relative to the right.   Plantar responses are flexor on the right and extensor on the left.    DIAGNOSTIC DATA (LABS, IMAGING, TESTING) - I reviewed patient records, labs, notes, testing and imaging myself where available.      ASSESSMENT AND PLAN  1. Multiple sclerosis ([place])   2. Left spastic hemiplegia (HCC)   3. Other fatigue   4. Urinary hesitancy   5. Vitamin D deficiency   6. High risk medication use   7. Left hand weakness     1.   Continue Ocrevus.  Check CBC with differential, IgG/IgM, TSH, vitamin D 2.   She has atrophy in the left hand.  Although lesions involving the anterior horn cells in the spinal cord (but her lesions are higher up in the cervical spine) can cause atrophy, this is unusual with MS and we need to check an NCV/EMG to make sure that there is not an ulnar or and/or median neuropathy on the left. 3.     Due to combination of her physical impairments, left spastic hemiplegia with leg and arm weakness, poor gait) and fatigue, she is unable to work and is fully disabled.  This is not expected to improve. 4.    Tamsulosin for urinary hesitancy. 5.  She will return to see me in 6 months but sooner if there are new or worsening neurologic symptoms.  40 minutes face-to-face evaluation with greater than half the time counseling coordinating about her MS, permanent impairments and disability issues   Ian Cavey A. Felecia Shelling, MD, PhD, FAAN Certified in Neurology, Clinical Neurophysiology, Sleep Medicine, Pain Medicine and Neuroimaging Director, [place] [facility] [address] [place], [place] [address] [phone]

## 2019-01-22 ENCOUNTER — Telehealth: Payer: Self-pay | Admitting: *Deleted

## 2019-01-22 LAB — CBC WITH DIFFERENTIAL/PLATELET
Basophils Absolute: 0.1 10*3/uL (ref 0.0–0.2)
Basos: 1 %
EOS (ABSOLUTE): 0.1 10*3/uL (ref 0.0–0.4)
Eos: 2 %
Hematocrit: 37.8 % (ref 34.0–46.6)
Hemoglobin: 12.4 g/dL (ref 11.1–15.9)
Immature Grans (Abs): 0 10*3/uL (ref 0.0–0.1)
Immature Granulocytes: 1 %
Lymphocytes Absolute: 0.7 10*3/uL (ref 0.7–3.1)
Lymphs: 9 %
MCH: 28.9 pg (ref 26.6–33.0)
MCHC: 32.8 g/dL (ref 31.5–35.7)
MCV: 88 fL (ref 79–97)
Monocytes Absolute: 0.8 10*3/uL (ref 0.1–0.9)
Monocytes: 10 %
Neutrophils Absolute: 5.9 10*3/uL (ref 1.4–7.0)
Neutrophils: 77 %
Platelets: 299 10*3/uL (ref 150–450)
RBC: 4.29 x10E6/uL (ref 3.77–5.28)
RDW: 13.7 % (ref 11.7–15.4)
WBC: 7.6 10*3/uL (ref 3.4–10.8)

## 2019-01-22 LAB — IGG, IGA, IGM
IgA/Immunoglobulin A, Serum: 330 mg/dL (ref 87–352)
IgG (Immunoglobin G), Serum: 1053 mg/dL (ref 586–1602)
IgM (Immunoglobulin M), Srm: 97 mg/dL (ref 26–217)

## 2019-01-22 LAB — THYROID PANEL WITH TSH
Free Thyroxine Index: 1.9 (ref 1.2–4.9)
T3 Uptake Ratio: 27 % (ref 24–39)
T4, Total: 6.9 ug/dL (ref 4.5–12.0)
TSH: 2.4 u[IU]/mL (ref 0.450–4.500)

## 2019-01-22 LAB — VITAMIN D 25 HYDROXY (VIT D DEFICIENCY, FRACTURES): Vit D, 25-Hydroxy: 54 ng/mL (ref 30.0–100.0)

## 2019-01-22 NOTE — Telephone Encounter (Signed)
-----   Message from Britt Bottom, MD sent at 01/22/2019  8:59 AM EDT ----- Please let the patient know that the lab work is fine.

## 2019-01-23 ENCOUNTER — Encounter: Payer: Self-pay | Admitting: Osteopathic Medicine

## 2019-01-23 NOTE — Telephone Encounter (Signed)
See attachment sent by patient, can we abstract immunizations and confirm medication list?  Thank you!

## 2019-01-27 DIAGNOSIS — M545 Low back pain: Secondary | ICD-10-CM | POA: Diagnosis not present

## 2019-01-27 DIAGNOSIS — M4180 Other forms of scoliosis, site unspecified: Secondary | ICD-10-CM | POA: Diagnosis not present

## 2019-01-30 DIAGNOSIS — R262 Difficulty in walking, not elsewhere classified: Secondary | ICD-10-CM | POA: Diagnosis not present

## 2019-01-30 DIAGNOSIS — M545 Low back pain: Secondary | ICD-10-CM | POA: Diagnosis not present

## 2019-01-30 DIAGNOSIS — M6281 Muscle weakness (generalized): Secondary | ICD-10-CM | POA: Diagnosis not present

## 2019-01-31 DIAGNOSIS — M6281 Muscle weakness (generalized): Secondary | ICD-10-CM | POA: Diagnosis not present

## 2019-01-31 DIAGNOSIS — R262 Difficulty in walking, not elsewhere classified: Secondary | ICD-10-CM | POA: Diagnosis not present

## 2019-01-31 DIAGNOSIS — M545 Low back pain: Secondary | ICD-10-CM | POA: Diagnosis not present

## 2019-02-05 DIAGNOSIS — M545 Low back pain: Secondary | ICD-10-CM | POA: Diagnosis not present

## 2019-02-05 DIAGNOSIS — R262 Difficulty in walking, not elsewhere classified: Secondary | ICD-10-CM | POA: Diagnosis not present

## 2019-02-05 DIAGNOSIS — M6281 Muscle weakness (generalized): Secondary | ICD-10-CM | POA: Diagnosis not present

## 2019-02-08 DIAGNOSIS — M6281 Muscle weakness (generalized): Secondary | ICD-10-CM | POA: Diagnosis not present

## 2019-02-08 DIAGNOSIS — R262 Difficulty in walking, not elsewhere classified: Secondary | ICD-10-CM | POA: Diagnosis not present

## 2019-02-08 DIAGNOSIS — M545 Low back pain: Secondary | ICD-10-CM | POA: Diagnosis not present

## 2019-02-10 DIAGNOSIS — M545 Low back pain: Secondary | ICD-10-CM | POA: Diagnosis not present

## 2019-02-10 DIAGNOSIS — R262 Difficulty in walking, not elsewhere classified: Secondary | ICD-10-CM | POA: Diagnosis not present

## 2019-02-10 DIAGNOSIS — M6281 Muscle weakness (generalized): Secondary | ICD-10-CM | POA: Diagnosis not present

## 2019-02-11 DIAGNOSIS — M5136 Other intervertebral disc degeneration, lumbar region: Secondary | ICD-10-CM | POA: Diagnosis not present

## 2019-02-15 DIAGNOSIS — M6281 Muscle weakness (generalized): Secondary | ICD-10-CM | POA: Diagnosis not present

## 2019-02-15 DIAGNOSIS — M545 Low back pain: Secondary | ICD-10-CM | POA: Diagnosis not present

## 2019-02-15 DIAGNOSIS — R262 Difficulty in walking, not elsewhere classified: Secondary | ICD-10-CM | POA: Diagnosis not present

## 2019-02-17 DIAGNOSIS — M6281 Muscle weakness (generalized): Secondary | ICD-10-CM | POA: Diagnosis not present

## 2019-02-17 DIAGNOSIS — R262 Difficulty in walking, not elsewhere classified: Secondary | ICD-10-CM | POA: Diagnosis not present

## 2019-02-17 DIAGNOSIS — M545 Low back pain: Secondary | ICD-10-CM | POA: Diagnosis not present

## 2019-02-21 DIAGNOSIS — L409 Psoriasis, unspecified: Secondary | ICD-10-CM | POA: Diagnosis not present

## 2019-02-25 ENCOUNTER — Other Ambulatory Visit: Payer: Self-pay

## 2019-02-25 ENCOUNTER — Encounter: Payer: BLUE CROSS/BLUE SHIELD | Admitting: Neurology

## 2019-02-25 ENCOUNTER — Ambulatory Visit (INDEPENDENT_AMBULATORY_CARE_PROVIDER_SITE_OTHER): Payer: BLUE CROSS/BLUE SHIELD | Admitting: Neurology

## 2019-02-25 DIAGNOSIS — R29898 Other symptoms and signs involving the musculoskeletal system: Secondary | ICD-10-CM | POA: Diagnosis not present

## 2019-02-25 DIAGNOSIS — M62542 Muscle wasting and atrophy, not elsewhere classified, left hand: Secondary | ICD-10-CM | POA: Diagnosis not present

## 2019-02-25 DIAGNOSIS — Z0289 Encounter for other administrative examinations: Secondary | ICD-10-CM

## 2019-02-25 DIAGNOSIS — M62549 Muscle wasting and atrophy, not elsewhere classified, unspecified hand: Secondary | ICD-10-CM | POA: Insufficient documentation

## 2019-02-25 NOTE — Progress Notes (Signed)
Full Name: Meredith Hale Gender: Female MRN #: [id number] Date of Birth: [birth year]    Visit Date: 02/25/2019 13:46 Age: 58 Years 9 Months Old Examining Physician: Despina Arias, MD  Referring Physician: Despina Arias, MD     History: She is a 58 year old woman with multiple sclerosis who has had progressive weakness in the right arm and atrophy in the right hand.  Nerve conduction studies: The right ulnar motor response was mildly slowed across the elbow without a drop in amplitude.  Bilateral median and the left ulnar ulnar and bilateral radial motor responses had normal distal latencies, amplitude and conduction velocities.  Bilateral median, ulnar and radial sensory responses were normal.  The right ulnar and median F-wave responses were normal.  Electromyography: Needle EMG of selected muscles of the left arm was performed.  There was no abnormal spontaneous activity.  There was a reduced recruitment pattern noted in the triceps and the intrinsic hand muscles tested.  Motor unit morphology showed some polyphasic units in the triceps and the FDIO.    Impression: This NCV/EMG study shows the following: 1.   Mild left ulnar neuropathy.  This would not be expected to cause atrophy. 2.   Reduced recruitment in the C7 and C8 innervated muscles could be due minimal radiculopathy.  More likely in this MS patient with significant atrophy in the hand muscles, the pattern is due to a chronic demyelinating plaque involving the anterior horn cells.  Lynx Goodrich A. Epimenio Foot, MD, PhD, FAAN Certified in Neurology, Clinical Neurophysiology, Sleep Medicine, Pain Medicine and Neuroimaging Director, [facility]  [facility] [address] [place], [place] [address] [phone]         [facility]    Nerve / Sites Muscle Latency Ref. Amplitude Ref. Rel Amp Segments Distance Velocity Ref. Area    ms ms mV mV %  cm m/s m/s  mVms  R Median - APB     Wrist APB 3.3 ?4.4 10.0 ?4.0 100 Wrist - APB 7   24.9     Upper arm APB 7.0  9.2  92 Upper arm - Wrist 21 57 ?49 25.4  L Median - APB     Wrist APB 3.3 ?4.4 8.1 ?4.0 100 Wrist - APB 7   27.5     Upper arm APB 7.4  7.8  96 Upper arm - Wrist 21 52 ?49 26.7  R Ulnar - ADM     Wrist ADM 2.4 ?3.3 9.5 ?6.0 100 Wrist - ADM 7   37.4     B.Elbow ADM 5.3  9.1  95.8 B.Elbow - Wrist 19 65 ?49 36.3     A.Elbow ADM 7.1  8.8  96.6 A.Elbow - B.Elbow 10 56 ?49 35.8         A.Elbow - Wrist      L Ulnar - ADM     Wrist ADM 2.5 ?3.3 9.2 ?6.0 100 Wrist - ADM 7   28.6     B.Elbow ADM 5.8  8.1  87.7 B.Elbow - Wrist 18 55 ?49 25.1     A.Elbow ADM 8.3  8.0  98.7 A.Elbow - B.Elbow 10 39 ?49 27.1         A.Elbow - Wrist      R Radial - EIP     Forearm EIP 1.8 ?2.9 5.0 ?2.0 100 Forearm - EIP 4  ?49 22.3     Elbow EIP 3.4  5.0  101 Elbow - Forearm 12 74  23.3     Spiral Gr EIP 5.9  5.0  101 Spiral Gr - Elbow 13 53  22.3  L Radial - EIP     Forearm EIP 2.0 ?2.9 3.6 ?2.0 100 Forearm - EIP 4  ?49 17.5     Elbow EIP 4.3  3.2  89.2 Elbow - Forearm 14 61  15.8     Spiral Gr EIP 6.2  3.1  97.2 Spiral Gr - Elbow 12 62  16.7                      SNC    Nerve / Sites Rec. Site Peak Lat Ref.  Amp Ref. Segments Distance Peak Diff Ref.    ms ms V V  cm ms ms  R Median - Orthodromic (Dig II, Mid palm)     Forearm Wrist 2.9 ?2.9 23 ?15 Forearm - Wrist 10    L Radial - Anatomical snuff box (Forearm)     Forearm Wrist 2.1 ?2.9 35 ?15 Forearm - Wrist 10    R Median, Ulnar - Transcarpal comparison     Median Palm Wrist 2.2 ?2.2 79 ?35 Median Palm - Wrist 8       Ulnar Palm Wrist 1.9 ?2.2 31 ?12 Ulnar Palm - Wrist 8          Median Palm - Ulnar Palm  0.3 ?0.4  L Median, Ulnar - Transcarpal comparison     Median Palm Wrist 1.8 ?2.2 97 ?35 Median Palm - Wrist 8       Ulnar Palm Wrist 1.9 ?2.2 17 ?12 Ulnar Palm - Wrist 8          Median Palm - Ulnar Palm  -0.1 ?0.4  R Radial - Anatomical snuff  box (Forearm)     Dig II Wrist 2.1 ?3.4 28 ?10 Dig II - Wrist 13    L Median - Orthodromic (Dig II, Mid palm)     Dig II Wrist 2.6 ?3.4 27 ?10 Dig II - Wrist 13    R Ulnar - Orthodromic, (Dig V, Mid palm)     Dig V Wrist 2.5 ?3.1 11 ?5 Dig V - Wrist 11    L Ulnar - Orthodromic, (Dig V, Mid palm)     Dig V Wrist 2.5 ?3.1 8 ?5 Dig V - Wrist 30                       F  Wave    Nerve F Lat Ref.   ms ms  R Ulnar - ADM 27.6 ?32.0  L Ulnar - ADM 26.4 ?32.0         EMG full       EMG Summary Table    Spontaneous MUAP Recruitment  Muscle IA Fib PSW Fasc Other Amp Dur. Poly Pattern  L. Deltoid Normal None None None _______ Normal Normal Normal Normal  L. Triceps brachii Normal None None None _______ Normal Normal 1+ Reduced  L. Biceps brachii Normal None None None _______ Normal Normal Normal Normal  L. Extensor digitorum communis Normal None None None _______ Normal Normal Normal Normal  L. First dorsal interosseous Normal None None None _______ Normal Normal 1+ Reduced  L. Abductor pollicis brevis Normal None None None _______ Normal Normal Normal Reduced

## 2019-03-04 DIAGNOSIS — M5136 Other intervertebral disc degeneration, lumbar region: Secondary | ICD-10-CM | POA: Diagnosis not present

## 2019-03-05 ENCOUNTER — Telehealth: Payer: Self-pay | Admitting: Neurology

## 2019-03-05 MED ORDER — BACLOFEN 20 MG PO TABS: 20.0000 mg | ORAL_TABLET | Freq: Four times a day (QID) | ORAL | 3 refills | Status: DC

## 2019-03-05 MED ORDER — TIZANIDINE HCL 4 MG PO TABS: ORAL_TABLET | ORAL | 3 refills | Status: DC

## 2019-03-05 NOTE — Telephone Encounter (Signed)
E-scribed refills as requested.  

## 2019-03-05 NOTE — Addendum Note (Signed)
Addended by: Hope Pigeon on: 03/05/2019 02:45 PM   Modules accepted: Orders

## 2019-03-05 NOTE — Telephone Encounter (Signed)
Pt has called for a refill on her baclofen (LIORESAL) 20 MG tablet  tiZANidine (ZANAFLEX) 4 MG tablet  [id number]

## 2019-03-16 ENCOUNTER — Telehealth (HOSPITAL_COMMUNITY): Payer: Self-pay | Admitting: Neurology

## 2019-03-16 NOTE — Telephone Encounter (Signed)
I returned patient's call to answering machine early this morning at 5 AM.  She informed me that she was having severe headaches for the last 10 days following an epidural steroid injection for back pain by Dr. Suella Broad at [facility].  The headache was mostly in upright position and not getting better.  I felt the headache was likely a spinal headache and advised her to drink lots of fluids and caffeine and bedrest.  She was advised to call Dr. Nelva Bush or [facility] on call and discussed epidural blood patch.  She voiced understanding.

## 2019-03-17 DIAGNOSIS — R51 Headache: Secondary | ICD-10-CM | POA: Diagnosis not present

## 2019-03-17 DIAGNOSIS — M48061 Spinal stenosis, lumbar region without neurogenic claudication: Secondary | ICD-10-CM | POA: Diagnosis not present

## 2019-03-17 DIAGNOSIS — Z79899 Other long term (current) drug therapy: Secondary | ICD-10-CM | POA: Diagnosis not present

## 2019-03-17 DIAGNOSIS — I959 Hypotension, unspecified: Secondary | ICD-10-CM | POA: Diagnosis not present

## 2019-03-17 DIAGNOSIS — G35 Multiple sclerosis: Secondary | ICD-10-CM | POA: Diagnosis not present

## 2019-03-17 DIAGNOSIS — M545 Low back pain: Secondary | ICD-10-CM | POA: Diagnosis not present

## 2019-03-17 DIAGNOSIS — R410 Disorientation, unspecified: Secondary | ICD-10-CM | POA: Diagnosis not present

## 2019-03-17 DIAGNOSIS — M419 Scoliosis, unspecified: Secondary | ICD-10-CM | POA: Diagnosis not present

## 2019-03-17 DIAGNOSIS — N390 Urinary tract infection, site not specified: Secondary | ICD-10-CM | POA: Diagnosis not present

## 2019-03-17 DIAGNOSIS — I6782 Cerebral ischemia: Secondary | ICD-10-CM | POA: Diagnosis not present

## 2019-03-17 DIAGNOSIS — R52 Pain, unspecified: Secondary | ICD-10-CM | POA: Diagnosis not present

## 2019-03-17 DIAGNOSIS — R4182 Altered mental status, unspecified: Secondary | ICD-10-CM | POA: Diagnosis not present

## 2019-03-17 DIAGNOSIS — G92 Toxic encephalopathy: Secondary | ICD-10-CM | POA: Diagnosis not present

## 2019-03-17 DIAGNOSIS — R9089 Other abnormal findings on diagnostic imaging of central nervous system: Secondary | ICD-10-CM | POA: Diagnosis not present

## 2019-03-17 DIAGNOSIS — M47816 Spondylosis without myelopathy or radiculopathy, lumbar region: Secondary | ICD-10-CM | POA: Diagnosis not present

## 2019-03-17 DIAGNOSIS — R404 Transient alteration of awareness: Secondary | ICD-10-CM | POA: Diagnosis not present

## 2019-03-17 DIAGNOSIS — G8194 Hemiplegia, unspecified affecting left nondominant side: Secondary | ICD-10-CM | POA: Diagnosis not present

## 2019-03-17 DIAGNOSIS — M5136 Other intervertebral disc degeneration, lumbar region: Secondary | ICD-10-CM | POA: Diagnosis not present

## 2019-03-17 DIAGNOSIS — N3000 Acute cystitis without hematuria: Secondary | ICD-10-CM | POA: Diagnosis not present

## 2019-03-19 ENCOUNTER — Ambulatory Visit: Payer: Self-pay

## 2019-03-22 MED ORDER — LACTASE PO
1.00 | ORAL | Status: DC
Start: ? — End: 2019-03-22

## 2019-03-22 MED ORDER — ESTROPLUS PO TABS
1.00 | ORAL_TABLET | ORAL | Status: DC
Start: ? — End: 2019-03-22

## 2019-03-22 MED ORDER — Medication
Status: DC
Start: ? — End: 2019-03-22

## 2019-03-22 MED ORDER — SODIUM TETRADECYL SULFATE 3 % IV SOLN
1.00 | INTRAVENOUS | Status: DC
Start: ? — End: 2019-03-22

## 2019-03-22 MED ORDER — CHLOROBUTANOL-EUGENOL & APAP
0.40 | Status: DC
Start: ? — End: 2019-03-22

## 2019-03-22 MED ORDER — BARO-CAT PO
10.00 | ORAL | Status: DC
Start: ? — End: 2019-03-22

## 2019-03-22 MED ORDER — ONE-A-DAY WITHIN PO
30.00 | ORAL | Status: DC
Start: ? — End: 2019-03-22

## 2019-03-27 ENCOUNTER — Telehealth: Payer: Self-pay | Admitting: *Deleted

## 2019-03-27 NOTE — Telephone Encounter (Signed)
Called pt. Received paper referral that pt in hospital 03/17/19 for HA's, altered mental status, MS and they wanted her to f/u with neurology. Scheduled appt with Dr. Felecia Shelling 04/01/19 at 2:30pm, check in 2:00pm. Must wear mask, one visitor allowed but must wear mask. Denies any signs/sx of covid-19.

## 2019-03-28 ENCOUNTER — Telehealth: Payer: Self-pay | Admitting: Osteopathic Medicine

## 2019-03-28 NOTE — Telephone Encounter (Signed)
Meredith Hale states she is doing fine today. She was in the hospital for confusion and she states it was due to a UTI. She had no symptoms of a UTI. Transferred to the front to be scheduled for hospital follow up.

## 2019-03-28 NOTE — Telephone Encounter (Signed)
Patient called, stating that she was seen at [facility] for an acute UTI that turned into headaches, states that her medication is weakening her immune system, says her medication she is on for MS and wants to ask questions, wants to know if there is any way to self test. She was wanting to schedule a virtual with her PCP, no open slots available. Please Advise.

## 2019-03-28 NOTE — Telephone Encounter (Signed)
When patients call w/ complicated questions like this, please route them to triage in the future.   I'm not sure what the issue is based on this note... to triage: can we call patient and see what her specific concern/question is? She was in the hospital 03/17/19, admitted for altered mental status, hasn't had follow-up with me, needs HFU appt. If she's worried about new or worsening symptoms, would go to urgent care.

## 2019-03-31 ENCOUNTER — Encounter: Payer: Self-pay | Admitting: Osteopathic Medicine

## 2019-03-31 ENCOUNTER — Other Ambulatory Visit: Payer: Self-pay

## 2019-03-31 ENCOUNTER — Ambulatory Visit (INDEPENDENT_AMBULATORY_CARE_PROVIDER_SITE_OTHER): Payer: BLUE CROSS/BLUE SHIELD | Admitting: Osteopathic Medicine

## 2019-03-31 VITALS — BP 131/86 | HR 77 | Temp 98.3°F | Wt 209.8 lb

## 2019-03-31 DIAGNOSIS — Z23 Encounter for immunization: Secondary | ICD-10-CM | POA: Diagnosis not present

## 2019-03-31 DIAGNOSIS — R4182 Altered mental status, unspecified: Secondary | ICD-10-CM

## 2019-03-31 NOTE — Progress Notes (Signed)
HPI: Meredith DawesBarbara Hale is a 58 y.o. female who  has a past medical history of Multiple sclerosis (HCC), Psoriasis, and Scoliosis.  she presents to [facility] today, 03/31/19,  for chief complaint of: Hospital follow-up   Hospital follow-up: Presented to the ER with her boyfriend/partner, found to be "unresponsive" at home after complaining of headaches and feeling poorly for a couple of days.  was admitted 03/17/2019 for altered mental status due presumably to acute cystitis but UTI was eventually ruled out and antibiotics were discontinued.    Discharge summary reviewed - Presented to emergency department due to confusion and headaches, worsening about 3 days after patient had an epidural injection for low back pain on 03/11/2019.  MRI spine was performed and no evidence for abscess.    MRI of head and lumbar spine showed no lesions consistent with MS flare.  Imaging was reviewed by neurology, there was no concern for meningitis or need for LP.  Follow-up with neurology as scheduled for tomorrow.  given headaches, new medications at discharge included Fioricet, hydrocodone-acetaminophen 5-325 mg every 4 hours as needed.  At this point, patient states the headaches have essentially resolved.  She was surprised to find out that urine culture did not show any definitive UTI.  She reports that she has had maybe 3 UTIs over the course of the past year, she attributes this to the medications that she is currently taking for MS.  She has had no further episodes of altered mental status.     Past medical, surgical, social and family history reviewed:  Patient Active Problem List   Diagnosis Date Noted  . 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 Multiple sclerosis (HCC) 01/21/2019  . Left spastic hemiplegia (HCC) 01/21/2019  . Other fatigue 01/21/2019  . Urinary hesitancy 01/21/2019  . Vitamin D deficiency 01/21/2019  . Normal gynecologic examination 08/16/2018     Past Surgical History:  Procedure Laterality Date  . AUGMENTATION MAMMAPLASTY    . bone navicular  07/05/2006  . FINGER GANGLION CYST EXCISION    . FINGER SURGERY  06/28/2015   capsular release due to injury  . FOOT SURGERY     right and left foot  . FOOT SURGERY    . HEMORRHOIDECTOMY WITH HEMORRHOID BANDING  04/19/2017  . LUMBAR EPIDURAL INJECTION  04/27/2017  . lymph node removal  1976  . REFRACTIVE SURGERY  06/02/1994  . 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 Smoking status: Former Smoker    Packs/day: 1.00  . Smokeless tobacco: Never Used  Substance Use Topics  . Alcohol use: Yes    Comment: 1 per week    Family History  Adopted: Yes  Family history unknown: Yes     Current medication list and allergy/intolerance information reviewed:    Current Outpatient Medications  Medication Sig Dispense Refill  . baclofen (LIORESAL) 20 MG tablet Take 1 tablet (20 mg total) by mouth 4 (four) times daily. 360 each 3  . Biotin 1000 MCG tablet Take 1,000 mcg by mouth 3 (three) times daily.    Marland Kitchen. CALCIUM-VITAMIN D PO Take by mouth.    . Cholecalciferol (D3-1000) 25 MCG (1000 UT) capsule Take 1,000 Units by mouth daily.    . clobetasol ointment (TEMOVATE) 0.05 % Apply 1 application topically 2 (two) times daily.    . Clobetasol Propionate (CLOBEX SPRAY) 0.05 % external  spray Clobex 0.05 % topical spray    . fluocinonide (LIDEX) 0.05 % external solution Apply 1 application topically as needed.    . Multiple Vitamins-Minerals (MULTIVITAL) tablet Take 1 tablet by mouth daily.    . naproxen (NAPROSYN) 500 MG tablet Take 500 mg by mouth as needed.    Marland Kitchen. ocrelizumab 600 mg in sodium chloride 0.9 % 500 mL Inject 600 mg into the vein every 6 (six) months.    . Omega-3 Fatty Acids (FISH OIL) 1000 MG CAPS Take by mouth.    Marland Kitchen. omeprazole (PRILOSEC) 40 MG capsule Take 40 mg by mouth daily.    . Probiotic  Product (ALIGN PO) Take by mouth.    . tamsulosin (FLOMAX) 0.4 MG CAPS capsule Take 1 capsule (0.4 mg total) by mouth daily. 30 capsule 5  . tiZANidine (ZANAFLEX) 4 MG tablet Take 1 to 2 capsules nightly 180 tablet 3   No current facility-administered medications for this visit.     No Known Allergies    Review of Systems:   Constitutional:  No  fever, no chills, +recent illness, No unintentional weight changes. +significant fatigue.   HEENT: +headache has resolved, no vision change, no hearing change, No sore throat, No  sinus pressure  Cardiac: No  chest pain, No  pressure, No palpitations, No  Orthopnea  Respiratory:  No  shortness of breath. No  Cough  Gastrointestinal: No  abdominal pain, No  nausea, No  vomiting,  No  blood in stool, No  diarrhea, No  constipation   Musculoskeletal: No new myalgia/arthralgia  Skin: No  Rash, No other wounds/concerning lesions  Genitourinary: No  incontinence, No  abnormal genital bleeding, No abnormal genital discharge, no dysuria or urgency, patient does have days where she seems to urinate more frequently  Hem/Onc: No  easy bruising/bleeding  Neurologic: No  weakness, No  dizziness, No  slurred speech/focal weakness/facial droop  Psychiatric: No  concerns with depression, No  concerns with anxiety, No sleep problems, No mood problems  Exam:  BP 131/86 (BP Location: Left Arm, Patient Position: Sitting, Cuff Size: Large)   Pulse 77   Temp 98.3 F (36.8 C) (Oral)   Wt 209 lb 12.8 oz (95.2 kg)   BMI 32.86 kg/m   Constitutional: VS see above. General Appearance: alert, well-developed, well-nourished, NAD  Eyes: Normal lids and conjunctive, non-icteric sclera  Neck: No masses, trachea midline. No thyroid enlargement. No tenderness/mass appreciated. No lymphadenopathy  Respiratory: Normal respiratory effort. no wheeze, no rhonchi, no rales  Cardiovascular: S1/S2 normal, no murmur, no rub/gallop auscultated. RRR. No lower extremity  edema.   Gastrointestinal: Nontender, no masses.   Musculoskeletal: Gait normal.  Chronic weakness left lower extremity, brace is in place to prevent foot drop.  Patient ambulates with the assistance of a cane and is steady.  Neurological: Normal balance/coordination. No tremor.   Skin: warm, dry, intact. No rash/ulcer. No concerning nevi or subq nodules on limited exam.    Psychiatric: Normal judgment/insight. Normal mood and affect. Oriented x3.    No results found for this or any previous visit (from the past 72 hour(s)).  No results found.        ASSESSMENT/PLAN: The primary encounter diagnosis was Altered mental status, unspecified altered mental status type. Diagnoses of Need for influenza vaccination and Need for varicella vaccine were also pertinent to this visit.  I think we can reasonably recheck urinalysis, possible false negative but I really do not have a good explanation for why patient  experienced altered mental status as described by patient/chart.  She has follow-up with neurology tomorrow.  Advise discuss with them regarding medications and if they truly do pose a predisposition to urinary tract infections would certainly consider changing them or placing patient on prophylactic antibiotics.  Orders Placed This Encounter  Procedures  . Urine Culture  . Flu Vaccine QUAD 6+ mos PF IM (Fluarix Quad PF)  . Varicella-zoster vaccine IM (Shingrix)  . Urinalysis, Routine w reflex microscopic    No orders of the defined types were placed in this encounter.       Visit summary with medication list and pertinent instructions was printed for patient to review. All questions at time of visit were answered - patient instructed to contact office with any additional concerns or updates. ER/RTC precautions were reviewed with the patient.   Note: Total time spent 25 minutes, greater than 50% of the visit was spent face-to-face counseling and coordinating care for the above  diagnoses listed in assessment/plan.   Please note: voice recognition software was used to produce this document, and typos may escape review. Please contact Dr. Sheppard Coil for any needed clarifications.     Follow-up plan: Return for RECHECK PENDING RESULTS / IF WORSE OR CHANGE.

## 2019-04-01 ENCOUNTER — Encounter: Payer: Self-pay | Admitting: Neurology

## 2019-04-01 ENCOUNTER — Ambulatory Visit (INDEPENDENT_AMBULATORY_CARE_PROVIDER_SITE_OTHER): Payer: BLUE CROSS/BLUE SHIELD | Admitting: Neurology

## 2019-04-01 ENCOUNTER — Other Ambulatory Visit: Payer: Self-pay

## 2019-04-01 VITALS — BP 126/85 | HR 72 | Temp 97.1°F | Ht 67.0 in | Wt 209.5 lb

## 2019-04-01 DIAGNOSIS — G35 Multiple sclerosis: Secondary | ICD-10-CM | POA: Diagnosis not present

## 2019-04-01 DIAGNOSIS — G935 Compression of brain: Secondary | ICD-10-CM

## 2019-04-01 DIAGNOSIS — R5383 Other fatigue: Secondary | ICD-10-CM | POA: Diagnosis not present

## 2019-04-01 DIAGNOSIS — G8114 Spastic hemiplegia affecting left nondominant side: Secondary | ICD-10-CM

## 2019-04-01 DIAGNOSIS — M62542 Muscle wasting and atrophy, not elsewhere classified, left hand: Secondary | ICD-10-CM

## 2019-04-01 NOTE — Progress Notes (Addendum)
[facility]  PATIENT: Meredith DawesBarbara Shenefield DOB: 06/15/1961  REFERRING DOCTOR OR PCP: Dr. Merita Nortonound [facility]([place] [facility]) SOURCE: Patient, notes from Dr. Manson PasseyBrown, imaging and lab reports   _________________________________   HISTORICAL  CHIEF COMPLAINT:  Chief Complaint  Patient presents with   Follow-up    RM 12, alone. Last seen 01/21/19.  She [facility] for HA's, altered mental status, MS. She was there for a couple days. They wanted her to f/u with neurology They r/o UTI. Unclear of what caused episode. Pt tearful in office today, worried another epidose will happen. Her cognition has worsened per pt.    Multiple Sclerosis    On Ocrevus. Last infusion: 12/11/18 and next infusion 05/28/19.    Gait Problem    Ambulates with cane. She had a fall yesterday but denies hitting head or significant injury.     HISTORY OF PRESENT ILLNESS:  Meredith DawesBarbara Woodyard is a 58 year old woman with relapsing remitting MS diagnosed in 2006.   Update 04/01/2019: She is on Ocrevus as her disease modifying therapy for relapsing remitting MS.  The last infusion was 12/11/2018.  Her next infusion will be in November 2020.  She has tolerated it well.  IgG/IgM a couple months ago was normal.  We also check vitamin D in July and it was normal at 54.  She was [facility] to [facility] 03[facility]17/2019 after a severe headache.  The headache was worse laying down than sitting up.    She tried to take Tylenol but threw up several times.     She then lost cognitive abilities. She does not remember much after taking the Excedrin and Gatorade.   Her friend called the ambulance  She went to the ED. She had a small fever 99.x and a UTI was suspected but culture was negative (low colony count of lactobacillus).She was weaker than usual, nonspecifically.    She does not remember much in the ED.   She was in for 4 days.    She felt close to baseline at discharge.      MRI of the brain  02/17/2019 showed chronic lesions, known Chiari malformation but no acute findings.   I personally reviewed notes, laboratory reports and imaging reports.  MRI images were not available and they have been requested.  A week earlier (03/11/2019) she had an epidural injection lumbar or back pain.    She felt fine afterwards.   She had no headache until a week later.    Due to her progressive hand weakness and NCV/EMG was performed.  Although did show a mild left ulnar neuropathy, due to the extent of atrophy it was felt that there could also be involvement of the anterior horn cells, likely from the MS.  She is having more headaches at the base of the skull .   Naproxen helps for a few hours.  Impression: This NCV/EMG study shows the following: 1.   Mild left ulnar neuropathy.  This would not be expected to cause atrophy. 2.   Reduced recruitment in the C7 and C8 innervated muscles could be due minimal radiculopathy.  More likely in this MS patient with significant atrophy in the hand muscles, the pattern is due to a chronic demyelinating plaque involving the anterior horn cells.   Update 01/21/2019: She has been mostly stable with no new exacerbations.   She is on Ocrevus and tolerates it well.   She is currently on Ocrevus therapy and had her last  infusion 11/30/2017.  She has been using a cane x 2 years due to her left leg weakness, spasticity and clumsiness.  She notes milder right leg symptoms and has pronation of the right foot.   She wears braces on both legs.   She has left arm weakness as well.   She is left handed and writing and other skills are worse.   She also has spasms in her legs and sees some improvement with tizanidine and baclofen.    She has urinary hesitancy, reduced urinary stream and is not sure that she empties.   However, she has had only one UTI.      She has fatigue.   She feels cognition is a little off with some forgetfulness and reduced focus.  She might have mild depression.    She is more sleepy than she used to be.   She does not snore.   She does not have insomnia.  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   She is not working now due to her fatigue.        From 05/07/2018  She is a 58 year old woman who was diagnosed with MS in 2006.  She was mowing the lawn on a hot day when she felt weak and dizzy.   The next morning she woke up numb form the waist down.   When she did not improve, she saw her doctor.   She had MRIs and a lumbar puncture.   The MRI showed lesions consistent with MS,   She had oligoclonal bands in her CSF.   Initially, she was placed on Betaseron but had side effects causing body aches.  Then she was placed on Copaxone and switched to Tecfidera after having an exacerbation.  She tolerated Tecfidera well but her lymphocyte count dropped and she was switched to Aubagio.  More recently she switched from Aubagio to Ocrevus to be on a more effective medication.   She tolerates it well and her last infusion was Nov 30, 2017.   Her first infusion was November 2018.      Currently, she has weakness on her left side, leg and arm.   She has left foot drop and has trouble lifting the left leg when she walks.    She also has reduced ability to write (she is left handed)    She also has scoliosis and has had issues with her right foot.   Balance is also off.     She uses a cane.   She has muscle spasticity, worse at night and helped by baclofen 20 mg po qid.    Even on the high dose baclofen she notes spasticity.   She does not feel too sleepy.     Bladder function is fine.    She notes ligh sensitivity.   She never had optic neuritis or diplopia.  She notes a lot of fatigue, much worse in heat, physical and cognitve.   She recently moved from [place] and did better there than here.  She has been on Nuvigil in the past, when she worked, and found a benefit.  She sleeps well most nights but is going through  menopause.    She notes occasional forgetfulness but no major problems with cognition.    Mood is doing well for the most part.      She used to work as a Air cabin crew.  Her last MRI was January 2018.    The brian showed white matter foci, mostly non-specific, but the cervical spinal cord showed at least 5 foci c/w demyelination.     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    She has scoliosis causing some back pain and has been told it has worsened.       REVIEW OF SYSTEMS: Constitutional: No fevers, chills, sweats, or change in appetite.  She has fatigue. Eyes: No visual changes, double vision, eye pain.  There is light sensitivity. Ear, nose and throat: No hearing loss, ear pain, nasal congestion, sore throat.  She has tinnitus. Cardiovascular: No chest pain, palpitations Respiratory: No shortness of breath at rest or with exertion.   No wheezes GastrointestinaI: No nausea, vomiting, diarrhea, abdominal pain, fecal incontinence Genitourinary: No dysuria, urinary retention or frequency.  No nocturia. Musculoskeletal: No neck pain, back pain.   She has scoliosis.   Integumentary: No rash, pruritus currently though she does have psoriasis and some skin moles Neurological: as above Psychiatric: No depression at this time.  No anxiety Endocrine: No palpitations, diaphoresis, change in appetite, change in weigh or increased thirst Hematologic/Lymphatic: No anemia, purpura, petechiae. Allergic/Immunologic: No itchy/runny eyes, nasal congestion, recent allergic reactions, rashes  ALLERGIES: No Known Allergies  HOME MEDICATIONS:  Current Outpatient Medications:    baclofen (LIORESAL) 20 MG tablet, Take 1 tablet (20 mg total) by mouth 4 (four) times daily., Disp: 360 each, Rfl: 3   Biotin 1000 MCG tablet, Take 1,000 mcg by mouth 3 (three) times daily., Disp: , Rfl:    CALCIUM-VITAMIN D PO, Take by mouth., Disp: , Rfl:    Cholecalciferol (D3-1000)  25 MCG (1000 UT) capsule, Take 1,000 Units by mouth daily., Disp: , Rfl:    clobetasol ointment (TEMOVATE) 0.05 %, Apply 1 application topically 2 (two) times daily., Disp: , Rfl:    Clobetasol Propionate (CLOBEX SPRAY) 0.05 % external spray, Clobex 0.05 % topical spray, Disp: , Rfl:    fluocinonide (LIDEX) 0.05 % external solution, Apply 1 application topically as needed., Disp: , Rfl:    Multiple Vitamins-Minerals (MULTIVITAL) tablet, Take 1 tablet by mouth daily., Disp: , Rfl:    naproxen (NAPROSYN) 500 MG tablet, Take 500 mg by mouth as needed., Disp: , Rfl:    ocrelizumab 600 mg in sodium chloride 0.9 % 500 mL, Inject 600 mg into the vein every 6 (six) months., Disp: , Rfl:    Omega-3 Fatty Acids (FISH OIL) 1000 MG CAPS, Take by mouth., Disp: , Rfl:    omeprazole (PRILOSEC) 40 MG capsule, Take 40 mg by mouth daily., Disp: , Rfl:    Probiotic Product (ALIGN PO), Take by mouth., Disp: , Rfl:    tamsulosin (FLOMAX) 0.4 MG CAPS capsule, Take 1 capsule (0.4 mg total) by mouth daily., Disp: 30 capsule, Rfl: 5   tiZANidine (ZANAFLEX) 4 MG tablet, Take 1 to 2 capsules nightly, Disp: 180 tablet, Rfl: 3  PAST MEDICAL HISTORY: Past Medical History:  Diagnosis Date   Multiple sclerosis (HCC)    Psoriasis    Scoliosis     PAST SURGICAL HISTORY: Past Surgical History:  Procedure Laterality Date   AUGMENTATION MAMMAPLASTY     bone navicular  07/05/2006   FINGER GANGLION CYST EXCISION     FINGER SURGERY  06/28/2015   capsular release due to injury   FOOT SURGERY     right and left foot   FOOT SURGERY     HEMORRHOIDECTOMY  WITH HEMORRHOID BANDING  04/19/2017   LUMBAR EPIDURAL INJECTION  04/27/2017   lymph node removal  1976   REFRACTIVE SURGERY  06/02/1994   THYROID SURGERY     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    FAMILY HISTORY: Family History  Adopted: Yes  Family history unknown: Yes    SOCIAL HISTORY:  Social History    Socioeconomic History   Marital status: Single    Spouse name: Not on file   Number of children: 0   Years of education: Masters   Highest education level: Not on file  Occupational History   Occupation: unemployed  Scientist, product/process development strain: Not on file   Food insecurity    Worry: Not on file    Inability: Not on file   Transportation needs    Medical: Not on file    Non-medical: Not on file  Tobacco Use   Smoking status: Former Smoker    Packs/day: 1.00   Smokeless tobacco: Never Used  Substance and Sexual Activity   Alcohol use: Yes    Comment: 1 per week   Drug use: Never   Sexual activity: Yes    Partners: Male  Lifestyle   Physical activity    Days per week: Not on file    Minutes per session: Not on file   Stress: Not on file  Relationships   Social connections    Talks on phone: Not on file    Gets together: Not on file    Attends religious service: Not on file    Active member of club or organization: Not on file    Attends meetings of clubs or organizations: Not on file    Relationship status: Not on file   Intimate partner violence    Fear of current or ex partner: Not on file    Emotionally abused: Not on file    Physically abused: Not on file    Forced sexual activity: Not on file  Other Topics Concern   Not on file  Social History Narrative   Lives w/ significant other, Forrest Moron   Caffeine use: 2 cups per day   Left handed but transitioning to be right handed d/t being weak in left hand     PHYSICAL EXAM  Vitals:   04/01/19 1404  BP: 126/85  Pulse: 72  Temp: (!) 97.1 F (36.2 C)  Weight: 209 lb 8 oz (95 kg)  Height: 5\' 7"  (1.702 m)    Body mass index is 32.81 kg/m.   General: The patient is well-developed and well-nourished and in no acute distress   Neck: The neck is supple, no carotid bruits are noted.  She has mild tenderness midline at the skull base   Skin: Extremities are without rash  or edema.   Neurologic Exam  Mental status: The patient is alert and oriented x 3 at the time of the examination. The patient has apparent normal recent and remote memory, with an apparently normal attention span and concentration ability.   Speech is normal.  Cranial nerves: Extraocular movements are full. Facial symmetry is present. There is good facial sensation to soft touch bilaterally.Facial strength is normal.  Trapezius and sternocleidomastoid strength is normal. No dysarthria is noted. No obvious hearing deficits are noted.  Motor:  There is some increased muscle tone in the left leg.. Strength is  5 / 5 on the right.  Strength is 4/5 in the left arm except  2+ to 3/5 in the intrinsic hand muscles.  She has atrophy of the intrinsic hand muscles..  Strength is 2/5 in the left iliopsoas, 4/5 in the left quadriceps, 4-/5 in the left hamstrings, 2-/5 in the left foot and ankle.  Sensory: She has intact sensation to touch and vibration.  Coordination: Cerebellar testing reveals good finger-nose-finger and heel-to-shin bilaterally.  Gait and station: Station is normal.  She is wearing a left AFO brace.  Very poor gait due to the left weakness..  She is unable to do a tandem walk.  The Romberg is borderline..   Reflexes: Deep tendon reflexes are symmetric and increased on the left relative to the right.   Plantar responses are flexor on the right and extensor on the left.    DIAGNOSTIC DATA (LABS, IMAGING, TESTING) - I reviewed patient records, labs, notes, testing and imaging myself where available.      ASSESSMENT AND PLAN  1. Multiple sclerosis (HCC)   2. Left spastic hemiplegia (HCC)   3. Atrophy of muscle of left hand   4. Other fatigue   5. Chiari malformation type I (HCC)     1.   Continue Ocrevus for relapsing remitting MS.  The recent episode with cognitive changes is not consistent with an MS exacerbation  2.   I do not know what cause her severe headache, vomiting  and cognitive changes.  She does have a mild Chiari malformation.  There is no tonsillar pegging of the brainstem on the last MRI we performed.  She did have an epidural steroid injection 6 days or so before her symptoms.  It is possible that a spinal fluid leak could have led to worsening of the Chiari due to intracranial hypotension which might have caused some of the symptoms.  Chiari malformations have been associated with some subtle cognitive changes but she had more severe acute change earlier this month 3.    I will get the actual MRI images from earlier this month and compare side-by-side with her previous MRI to determine if there was evidence of intracranial hypertension (meningeal enhancement or worsening of the Chiari malformation)  4.  She will return to see me in 6 months but sooner if there are new or worsening neurologic symptoms.  40-minute face-to-face evaluation with greater than half the time counseling coordinating about her MS and recent hospital stay for acute cognitive change   Lysle Yero A. Epimenio FootSater, MD, PhD, FAAN Certified in Neurology, Clinical Neurophysiology, Sleep Medicine, Pain Medicine and Neuroimaging Director, [facility]  [facility] [address], [address] [place], [place] [address] [phone]

## 2019-04-02 LAB — URINALYSIS, ROUTINE W REFLEX MICROSCOPIC
Bilirubin Urine: NEGATIVE
Glucose, UA: NEGATIVE
Hgb urine dipstick: NEGATIVE
Ketones, ur: NEGATIVE
Leukocytes,Ua: NEGATIVE
Nitrite: NEGATIVE
Protein, ur: NEGATIVE
Specific Gravity, Urine: 1.018 (ref 1.001–1.03)
pH: <=5 (ref 5.0–8.0)

## 2019-04-02 LAB — URINE CULTURE
MICRO NUMBER:: 877958
Result:: NO GROWTH
SPECIMEN QUALITY:: ADEQUATE

## 2019-04-07 ENCOUNTER — Ambulatory Visit: Payer: BLUE CROSS/BLUE SHIELD | Admitting: Podiatry

## 2019-04-09 ENCOUNTER — Telehealth: Payer: Self-pay | Admitting: Neurology

## 2019-04-09 NOTE — Telephone Encounter (Signed)
Phone rep checked office voicemail, pt is asking if her MRI film has been reviewed, she is asking for a call to discuss the results

## 2019-04-09 NOTE — Telephone Encounter (Signed)
Please see if we can get the MRI images for the brain performed at [place] earlier this month on CD asap so I can compare with her previous films

## 2019-04-10 ENCOUNTER — Ambulatory Visit: Payer: 59 | Admitting: Podiatry

## 2019-04-10 ENCOUNTER — Telehealth: Payer: Self-pay | Admitting: *Deleted

## 2019-04-10 NOTE — Telephone Encounter (Signed)
R/c report and cd from [place]. CD and report on Emma desk

## 2019-04-14 ENCOUNTER — Telehealth: Payer: Self-pay | Admitting: *Deleted

## 2019-04-14 NOTE — Telephone Encounter (Signed)
-----   Message from Britt Bottom, MD sent at 04/12/2019 10:10 PM EDT ----- Regarding: MRI Please let her know that I looked at her brain MRI and compared it side-by-side to the one from last year.  The MRI of the brain looks the same.  Both show a mild Chiari malformation that has not worsened over the last year.  She does have a couple of spots in the brain just like last year.  After contrast there was a normal enhancement pattern.

## 2019-04-14 NOTE — Telephone Encounter (Signed)
Called and spoke with pt. Relayed results per Dr. Felecia Shelling note. She verbalized understanding. She is wanting to r/s her f/u from 2021 to end on December. Advised schedule not open yet for  Dr. Felecia Shelling. (She does not want to see NP). She will call back in 1-2 weeks to try and r/s that appt.

## 2019-04-15 ENCOUNTER — Other Ambulatory Visit: Payer: Self-pay

## 2019-04-15 ENCOUNTER — Ambulatory Visit: Payer: BLUE CROSS/BLUE SHIELD | Admitting: Orthotics

## 2019-04-15 DIAGNOSIS — M722 Plantar fascial fibromatosis: Secondary | ICD-10-CM

## 2019-04-15 DIAGNOSIS — M779 Enthesopathy, unspecified: Secondary | ICD-10-CM

## 2019-04-15 DIAGNOSIS — M7750 Other enthesopathy of unspecified foot: Secondary | ICD-10-CM

## 2019-04-15 NOTE — Progress Notes (Signed)
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   She wants to see if enough control can be accomplished with mezzo rather than Az.  Patient has good ROM all planes with little discomfort in Subtalor Neutral.  Mezzo was cast in ST neutral.   Dawn will verify insurance.

## 2019-04-22 ENCOUNTER — Telehealth: Payer: Self-pay | Admitting: Podiatry

## 2019-04-22 NOTE — Telephone Encounter (Signed)
Left message for pt to call to discuss benefits. Covered @ 100% of allowable if no office visit is filled.Marland Kitchen

## 2019-05-12 ENCOUNTER — Other Ambulatory Visit: Payer: Self-pay | Admitting: Neurology

## 2019-05-12 ENCOUNTER — Other Ambulatory Visit: Payer: Self-pay

## 2019-05-12 ENCOUNTER — Telehealth: Payer: Self-pay

## 2019-05-12 ENCOUNTER — Ambulatory Visit (INDEPENDENT_AMBULATORY_CARE_PROVIDER_SITE_OTHER): Payer: BLUE CROSS/BLUE SHIELD | Admitting: Orthotics

## 2019-05-12 DIAGNOSIS — M722 Plantar fascial fibromatosis: Secondary | ICD-10-CM | POA: Diagnosis not present

## 2019-05-12 DIAGNOSIS — M7750 Other enthesopathy of unspecified foot: Secondary | ICD-10-CM

## 2019-05-12 DIAGNOSIS — R269 Unspecified abnormalities of gait and mobility: Secondary | ICD-10-CM

## 2019-05-12 DIAGNOSIS — M779 Enthesopathy, unspecified: Secondary | ICD-10-CM

## 2019-05-12 DIAGNOSIS — G8114 Spastic hemiplegia affecting left nondominant side: Secondary | ICD-10-CM

## 2019-05-12 NOTE — Telephone Encounter (Signed)
Pt called stating she saw a foot specialist. As per pt, the specialist is requesting for pt to get a rx for a knee brace and AFO. The rx is for assistance in hyperextension and flip drop. Pt express for rx to be printed. Pls advise, thanks.

## 2019-05-12 NOTE — Progress Notes (Addendum)
Patient came in today to pick up Meredith Hale (R)  Patient was evaluated for fit and function.   The brace fit very well and there were any complaints of the way it felt once donned.  The brace offered ankle stability in both saggital and coroneal planes.  Patient advised to always wear proper fitting shoes with brace.  Patient also was referred back to PCP to evaluate for referral to [facility].  [facility] to assess proper orthotic intervention to address LEFT drop foot, knee ligamentous laxity, genu recurvatum (hyperextension of knee).  Possibly custom knee brace, and/or thermoplastic AFO w/ dorsi assist as well as posterior stops.

## 2019-05-13 NOTE — Telephone Encounter (Signed)
Call her and get the name of podiatry office and please call that office to clarify.  There is no reason that they should not be able to order necessary medical equipment for patients.  Or at least send me a notation with specifically what is required so I am not having to guess what the patient needs.

## 2019-05-13 NOTE — Telephone Encounter (Signed)
Attempted to contact pt regarding their rx request. No answer, left a detailed vm msg for pt to return a call back. Aware clinic is requesting additional clarification. Direct call back info provided.

## 2019-05-14 ENCOUNTER — Telehealth: Payer: Self-pay | Admitting: Podiatry

## 2019-05-14 ENCOUNTER — Encounter: Payer: Self-pay | Admitting: *Deleted

## 2019-05-14 NOTE — Telephone Encounter (Signed)
Pt called and left message asking if Liliane Channel could call the cma Vanicia @ her pcp's office with information regarding what brace he was recommending. The direct # is [phone]. And to call pt if any questions.

## 2019-05-15 NOTE — Telephone Encounter (Signed)
Podiatrist office called clarifying pt's rx request. As per Podiatrist, he gave pt the information regarding what needs to be ordered. Podiatrist will addend his notes to clarify rx required for pt.

## 2019-05-15 NOTE — Telephone Encounter (Signed)
As per Podiatrist notes for reason for request of brace rx -   " Patient also was referred back to PCP to evaluate for referral to [facility].  [facility] to assess proper orthotic intervention to address LEFT drop foot, knee ligamentous laxity, genu recurvatum (hyperextension of knee).  Possibly custom knee brace, and/or thermoplastic AFO w/ dorsi assist as well as posterior stops". Pls advise, thanks

## 2019-05-19 DIAGNOSIS — R269 Unspecified abnormalities of gait and mobility: Secondary | ICD-10-CM | POA: Insufficient documentation

## 2019-05-19 NOTE — Telephone Encounter (Signed)
Will d/w sports med, is this a referral or does she need supplies?

## 2019-05-19 NOTE — Telephone Encounter (Signed)
Left a detailed vm msg for pt regarding Sports Medicine - Dr. Darene Lamer notes. Direct call back info provided should pt have add'l inquiries.

## 2019-05-19 NOTE — Telephone Encounter (Signed)
I think they are referring to a KAFO (knee ankle-foot orthosis).  Referral placed to restore prosthetics and orthotics.  They will need to schedule an appointment with him to get him measured.  Ultimately yes podiatry should have been able to order this.

## 2019-05-19 NOTE — Telephone Encounter (Signed)
Pt called requesting an update for brace rx. As per pt, would like to pick up the rx sometime this week. The notes from recommending provider has been noted. Pls advise, thanks.

## 2019-05-19 NOTE — Assessment & Plan Note (Signed)
Referral to restore prosthetics and orthotics for LEFT drop foot, knee ligamentous laxity, genu recurvatum.

## 2019-05-26 DIAGNOSIS — D485 Neoplasm of uncertain behavior of skin: Secondary | ICD-10-CM | POA: Diagnosis not present

## 2019-05-26 DIAGNOSIS — L821 Other seborrheic keratosis: Secondary | ICD-10-CM | POA: Diagnosis not present

## 2019-05-26 DIAGNOSIS — L239 Allergic contact dermatitis, unspecified cause: Secondary | ICD-10-CM | POA: Diagnosis not present

## 2019-05-28 ENCOUNTER — Telehealth: Payer: Self-pay | Admitting: Osteopathic Medicine

## 2019-05-28 NOTE — Telephone Encounter (Signed)
Please schedule pt with next available. If that doesn't work out, pt is more than welcome to see another provider in the office. Thanks.

## 2019-05-28 NOTE — Telephone Encounter (Signed)
Patient states she needs a virtual visit to discuss knee brace. First available will not work for patient she states she needs something sooner. Please advise.

## 2019-05-28 NOTE — Telephone Encounter (Signed)
For knee brace, would probably recommend sports medicine visit anyway.  She is welcome to see Dr. Darene Lamer.

## 2019-05-29 ENCOUNTER — Telehealth: Payer: Self-pay | Admitting: Podiatry

## 2019-05-29 NOTE — Telephone Encounter (Signed)
Appointment has been made to with Dr.Thekkekandam. No further questions at this time.

## 2019-05-29 NOTE — Telephone Encounter (Signed)
Pt left message stating she has bad news the true life brace that she purchased earlier this year has broken.  I returned call after speaking to [place] and he said for her to drop it off and we will get it replaced under warranty.Pt stated she will be in the area next week and will drop it off.

## 2019-06-02 ENCOUNTER — Ambulatory Visit (INDEPENDENT_AMBULATORY_CARE_PROVIDER_SITE_OTHER): Payer: BLUE CROSS/BLUE SHIELD | Admitting: Sports Medicine

## 2019-06-02 ENCOUNTER — Other Ambulatory Visit: Payer: Self-pay

## 2019-06-02 DIAGNOSIS — G35D Multiple sclerosis, unspecified: Secondary | ICD-10-CM

## 2019-06-02 DIAGNOSIS — G35 Multiple sclerosis: Secondary | ICD-10-CM | POA: Diagnosis not present

## 2019-06-02 MED ORDER — AMBULATORY NON FORMULARY MEDICATION: 0 refills | Status: DC

## 2019-06-02 NOTE — Progress Notes (Signed)
Virtual Visit via WebEx/MyChart   I connected with  Meredith Hale  on 06/02/19 via WebEx/MyChart/Doximity Video and verified that I am speaking with the correct person using two identifiers.   I discussed the limitations, risks, security and privacy concerns of performing an evaluation and management service by WebEx/MyChart/Doximity Video, including the higher likelihood of inaccurate diagnosis and treatment, and the availability of in person appointments.  We also discussed the likely need of an additional face to face encounter for complete and high quality delivery of care.  I also discussed with the patient that there may be a patient responsible charge related to this service. The patient expressed understanding and wishes to proceed.  Provider location is either at home or medical facility. Patient location is at their home, different from provider location. People involved in care of the patient during this telehealth encounter were myself, my nurse/medical assistant, and my front office/scheduling team member.  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  I reviewed the past medical history, family history, social history, surgical history, and allergies today and no changes were needed.  Please see the problem list section below in epic for further details.  Past Medical History: Past Medical History:  Diagnosis Date  . Multiple sclerosis (HCC)   . Psoriasis   . Scoliosis    Past Surgical History: Past Surgical History:  Procedure Laterality Date  . AUGMENTATION MAMMAPLASTY    . bone navicular  07/05/2006  . FINGER GANGLION CYST EXCISION    . FINGER SURGERY  06/28/2015   capsular release due to injury  . FOOT SURGERY     right and left foot  . FOOT SURGERY    . HEMORRHOIDECTOMY  WITH HEMORRHOID BANDING  04/19/2017  . LUMBAR EPIDURAL INJECTION  04/27/2017  . lymph node removal  1976  . REFRACTIVE SURGERY  06/02/1994  . 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 Marital status: Single    Spouse name: Not on file  . Number of children: 0  . Years of education: Masters  . Highest education level: Not on file  Occupational History  . Occupation: unemployed  Social Needs  . Financial resource strain: Not on file  . Food insecurity    Worry: Not on file    Inability: Not on file  . Transportation needs    Medical: Not on file    Non-medical: Not on file  Tobacco Use  . Smoking status: Former Smoker    Packs/day: 1.00  . Smokeless tobacco: Never Used  Substance and Sexual Activity  . Alcohol use: Yes    Comment: 1 per week  . Drug use: Never  . Sexual activity: Yes    Partners: Male  Lifestyle  . Physical activity    Days per week: Not on file    Minutes per session: Not on file  . Stress: Not on file  Relationships  . Social Musician on phone: Not on file    Gets together: Not on file    Attends religious service: Not on file    Active member of club or organization: Not on file    Attends meetings  of clubs or organizations: Not on file    Relationship status: Not on file  Other Topics Concern  . Not on file  Social History Narrative   Lives w/ significant other, Forrest Moron   Caffeine use: 2 cups per day   Left handed but transitioning to be right handed d/t being weak in left hand   Family History: Family History  Adopted: Yes  Family history unknown: Yes   Allergies: No Known Allergies Medications: See med rec.  Review of Systems: No fevers, chills, night sweats, weight loss, chest pain, or shortness of breath.   Objective:    General: Speaking full sentences, no audible heavy breathing.  Sounds alert and  appropriately interactive.  Appears well.  Face symmetric.  Extraocular movements intact.  Pupils equal and round.  No nasal flaring or accessory muscle use visualized.  No other physical exam performed due to the non-physical nature of this visit.  Impression and Recommendations:    Multiple sclerosis (HCC) There was some confusion about the bracing, she specifically needs a knee brace on the right to prevent genu recurvatum, and an AFO on the left. Writing this order and this will be faxed to restore prosthetics and orthotics today. Order will be ambulatory/nonformulary in the medications tab.  I discussed the above assessment and treatment plan with the patient. The patient was provided an opportunity to ask questions and all were answered. The patient agreed with the plan and demonstrated an understanding of the instructions.   The patient was advised to call back or seek an in-person evaluation if the symptoms worsen or if the condition fails to improve as anticipated.   I provided 25 minutes of non-face-to-face time during this encounter, 15 minutes of additional time was needed to gather information, review chart, records, communicate/coordinate with staff remotely, troubleshooting the multiple errors that we get every time when trying to do video calls through the electronic medical record, WebEx, and Doximity, restart the encounter multiple times due to instability of the software, as well as complete documentation.   ___________________________________________ [name]. Dianah Field, M.D., ABFM., CAQSM. Primary Care and Sports Medicine [facility]  Adjunct Professor of [employer]

## 2019-06-02 NOTE — Assessment & Plan Note (Signed)
There was some confusion about the bracing, she specifically needs a knee brace on the right to prevent genu recurvatum, and an AFO on the left. Writing this order and this will be faxed to restore prosthetics and orthotics today. Order will be ambulatory/nonformulary in the medications tab.

## 2019-06-09 DIAGNOSIS — G35 Multiple sclerosis: Secondary | ICD-10-CM | POA: Diagnosis not present

## 2019-06-09 DIAGNOSIS — M79676 Pain in unspecified toe(s): Secondary | ICD-10-CM

## 2019-06-16 ENCOUNTER — Encounter: Payer: 59 | Admitting: Osteopathic Medicine

## 2019-06-20 DIAGNOSIS — L259 Unspecified contact dermatitis, unspecified cause: Secondary | ICD-10-CM | POA: Diagnosis not present

## 2019-07-02 ENCOUNTER — Ambulatory Visit: Payer: BLUE CROSS/BLUE SHIELD | Admitting: Podiatry

## 2019-07-03 ENCOUNTER — Other Ambulatory Visit: Payer: Self-pay

## 2019-07-03 ENCOUNTER — Other Ambulatory Visit: Payer: Self-pay | Admitting: Podiatry

## 2019-07-03 ENCOUNTER — Ambulatory Visit (INDEPENDENT_AMBULATORY_CARE_PROVIDER_SITE_OTHER): Payer: BLUE CROSS/BLUE SHIELD | Admitting: Podiatry

## 2019-07-03 ENCOUNTER — Ambulatory Visit: Payer: BLUE CROSS/BLUE SHIELD | Admitting: Orthotics

## 2019-07-03 ENCOUNTER — Ambulatory Visit (INDEPENDENT_AMBULATORY_CARE_PROVIDER_SITE_OTHER): Payer: BLUE CROSS/BLUE SHIELD

## 2019-07-03 ENCOUNTER — Encounter: Payer: Self-pay | Admitting: Podiatry

## 2019-07-03 DIAGNOSIS — M25571 Pain in right ankle and joints of right foot: Secondary | ICD-10-CM

## 2019-07-03 DIAGNOSIS — M7751 Other enthesopathy of right foot: Secondary | ICD-10-CM | POA: Diagnosis not present

## 2019-07-03 DIAGNOSIS — M779 Enthesopathy, unspecified: Secondary | ICD-10-CM

## 2019-07-03 DIAGNOSIS — M79671 Pain in right foot: Secondary | ICD-10-CM

## 2019-07-03 NOTE — Progress Notes (Signed)
Replaced broken AFO

## 2019-07-07 NOTE — Progress Notes (Signed)
Subjective:   Patient ID: Meredith Hale, female   DOB: 58 y.o.   MRN: [id number]   HPI patient presents stating that she felt like something happened to her right ankle and she is not sure what it may be and she wants to make sure she does not have a fracture.  Also states that she is got inflammation of her joint and something like she is had in the past    ROS      Objective:  Physical Exam  Neurovascular status intact with patient found to have normal perfusion of the digits and moderate diminishment range of motion subtalar ankle joint right which appears to be splinting     Assessment:  Inflammatory capsulitis right with a significant acute ankle sprain that has occurred     Plan:  H&P reviewed both conditions and today I went ahead did sterile prep and injected the sinus tarsi 3 mg Kenalog 5 mg Xylocaine discussed reduction of edema which I think is important and I advised this patient on elevation and reappoint to recheck  X-rays indicate that there is no signs of fracture of the ankle or diastases injury currently

## 2019-07-09 DIAGNOSIS — R269 Unspecified abnormalities of gait and mobility: Secondary | ICD-10-CM | POA: Diagnosis not present

## 2019-07-15 ENCOUNTER — Ambulatory Visit (INDEPENDENT_AMBULATORY_CARE_PROVIDER_SITE_OTHER): Payer: BLUE CROSS/BLUE SHIELD | Admitting: Neurology

## 2019-07-15 ENCOUNTER — Other Ambulatory Visit: Payer: Self-pay

## 2019-07-15 ENCOUNTER — Telehealth: Payer: Self-pay

## 2019-07-15 ENCOUNTER — Encounter: Payer: Self-pay | Admitting: Neurology

## 2019-07-15 ENCOUNTER — Encounter

## 2019-07-15 VITALS — BP 125/83 | HR 80 | Temp 97.9°F | Ht 66.75 in | Wt 220.0 lb

## 2019-07-15 DIAGNOSIS — G8114 Spastic hemiplegia affecting left nondominant side: Secondary | ICD-10-CM | POA: Diagnosis not present

## 2019-07-15 DIAGNOSIS — G35 Multiple sclerosis: Secondary | ICD-10-CM

## 2019-07-15 DIAGNOSIS — R3911 Hesitancy of micturition: Secondary | ICD-10-CM

## 2019-07-15 DIAGNOSIS — G935 Compression of brain: Secondary | ICD-10-CM

## 2019-07-15 DIAGNOSIS — R5383 Other fatigue: Secondary | ICD-10-CM

## 2019-07-15 DIAGNOSIS — R269 Unspecified abnormalities of gait and mobility: Secondary | ICD-10-CM | POA: Diagnosis not present

## 2019-07-15 MED ORDER — NAPROXEN 500 MG PO TABS: 500.0000 mg | ORAL_TABLET | ORAL | 5 refills | Status: DC | PRN

## 2019-07-15 MED ORDER — SOLIFENACIN SUCCINATE 5 MG PO TABS: 5.0000 mg | ORAL_TABLET | Freq: Every day | ORAL | 11 refills | Status: DC

## 2019-07-15 NOTE — Telephone Encounter (Signed)
Meredith Hale called and left a message to schedule last shingrix vaccine. I called and left her a message to call back to schedule. She is due for last vaccine.

## 2019-07-15 NOTE — Progress Notes (Signed)
[facility]  PATIENT: Meredith Hale DOB: [birth year]  REFERRING DOCTOR OR PCP: Dr. Merita Norton [facility] neurology) SOURCE: Patient, notes from Dr. Manson Passey, imaging and lab reports   _________________________________   HISTORICAL  CHIEF COMPLAINT:  Chief Complaint  Patient presents with  . Follow-up    Rm 12 here for 6 month f/u  . Multiple Sclerosis    Pt reports left hand weakness is worse and dizziness still present. Reports 2 falls since last visit no injury.   . Other    currently going through allergy testing.     HISTORY OF PRESENT ILLNESS:  Meredith Hale is a 58 year old woman with relapsing remitting MS diagnosed in 2006 though she had a Lhermitte sign in 1999.    Update 07/15/2019: She is on Ocrevus and she tolerates it well.     She is self isolating a lot due to Covid-19.   We discusse the vaccination.      Gait is reduced and sue uses a cane.    We discussed forearm crutches.     She denies any current numbness though she had a Lhermitte sign before her diagnosis.     Her left side is weaker with spasticity.   She takes baclofen and tizanidine with benefit.   .   She has trouble with some tasks using the left arm.     She has urinary urgency  She has fatigue, worse some days than others..   Sleep is good most nights She applied to Disability and was denied.   She has appealed.    Her insurance will change.    She takes Vit D supplements.   Her last level was 54 (normal).   Update 04/01/2019: She is on Ocrevus as her disease modifying therapy for relapsing remitting MS.  The last infusion was 12/11/2018.  Her next infusion will be in November 2020.  She has tolerated it well.  IgG/IgM a couple months ago was normal.  We also check vitamin D in July and it was normal at 54.  She was admitted to [facility] 03/17/2019 after a severe headache.  The headache was worse laying down than sitting up.    She tried to take Tylenol but threw up  several times.     She then lost cognitive abilities. She does not remember much after taking the Excedrin and Gatorade.   Her friend called the ambulance  She went to the ED. She had a small fever 99.x and a UTI was suspected but culture was negative (low colony count of lactobacillus).She was weaker than usual, nonspecifically.    She does not remember much in the ED.   She was in for 4 days.    She felt close to baseline at discharge.      MRI of the brain 02/17/2019 showed chronic lesions, known Chiari malformation but no acute findings.   I personally reviewed notes, laboratory reports and imaging reports.  MRI images were not available and they have been requested.  A week earlier (03/11/2019) she had an epidural injection lumbar or back pain.    She felt fine afterwards.   She had no headache until a week later.    Due to her progressive hand weakness and NCV/EMG was performed.  Although did show a mild left ulnar neuropathy, due to the extent of atrophy it was felt that there could also be involvement of the anterior horn cells, likely from the MS.  She is having  more headaches at the base of the skull .   Naproxen helps for a few hours.  Impression: This NCV/EMG study shows the following: 1.   Mild left ulnar neuropathy.  This would not be expected to cause atrophy. 2.   Reduced recruitment in the C7 and C8 innervated muscles could be due minimal radiculopathy.  More likely in this MS patient with significant atrophy in the hand muscles, the pattern is due to a chronic demyelinating plaque involving the anterior horn cells.   Update 01/21/2019: She has been mostly stable with no new exacerbations.   She is on Ocrevus and tolerates it well.   She is currently on Ocrevus therapy and had her last infusion 11/30/2017.  She has been using a cane x 2 years due to her left leg weakness, spasticity and clumsiness.  She notes milder right leg symptoms and has pronation of the right foot.   She wears braces  on both legs.   She has left arm weakness as well.   She is left handed and writing and other skills are worse.   She also has spasms in her legs and sees some improvement with tizanidine and baclofen.    She has urinary hesitancy, reduced urinary stream and is not sure that she empties.   However, she has had only one UTI.      She has fatigue.   She feels cognition is a little off with some forgetfulness and reduced focus.  She might have mild depression.   She is more sleepy than she used to be.   She does not snore.   She does not have insomnia.  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   She is not working now due to her fatigue.        From 05/07/2018  She is a 58 year old woman who was diagnosed with MS in 2006.  She was mowing the lawn on a hot day when she felt weak and dizzy.   The next morning she woke up numb form the waist down.   When she did not improve, she saw her doctor.   She had MRIs and a lumbar puncture.   The MRI showed lesions consistent with MS,   She had oligoclonal bands in her CSF.   Initially, she was placed on Betaseron but had side effects causing body aches.  Then she was placed on Copaxone and switched to Tecfidera after having an exacerbation.  She tolerated Tecfidera well but her lymphocyte count dropped and she was switched to Aubagio.  More recently she switched from Aubagio to Ocrevus to be on a more effective medication.   She tolerates it well and her last infusion was Nov 30, 2017.   Her first infusion was November 2018.      Currently, she has weakness on her left side, leg and arm.   She has left foot drop and has trouble lifting the left leg when she walks.    She also has reduced ability to write (she is left handed)    She also has scoliosis and has had issues with her right foot.   Balance is also off.     She uses a cane.   She has muscle spasticity, worse at night and helped by baclofen 20 mg po  qid.    Even on the high dose baclofen she notes spasticity.   She does not feel too sleepy.  Bladder function is fine.    She notes ligh sensitivity.   She never had optic neuritis or diplopia.  She notes a lot of fatigue, much worse in heat, physical and cognitve.   [place] recently moved from [place] and did better there than here.  She has been on Nuvigil in the past, when she worked, and found a benefit.  She sleeps well most nights but is going through menopause.    She notes occasional forgetfulness but no major problems with cognition.    Mood is doing well for the most part.      She used to work as a Air cabin crewbusiness analyst.       Her last MRI was January 2018.    The brian showed white matter foci, mostly non-specific, but the cervical spinal cord showed at least 5 foci c/w demyelination.     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    She has scoliosis causing some back pain and has been told it has worsened.       REVIEW OF SYSTEMS: Constitutional: No fevers, chills, sweats, or change in appetite.  She has fatigue. Eyes: No visual changes, double vision, eye pain.  There is light sensitivity. Ear, nose and throat: No hearing loss, ear pain, nasal congestion, sore throat.  She has tinnitus. Cardiovascular: No chest pain, palpitations Respiratory: No shortness of breath at rest or with exertion.   No wheezes GastrointestinaI: No nausea, vomiting, diarrhea, abdominal pain, fecal incontinence Genitourinary: No dysuria, urinary retention or frequency.  No nocturia. Musculoskeletal: No neck pain, back pain.   She has scoliosis.   Integumentary: No rash, pruritus currently though she does have psoriasis and some skin moles Neurological: as above Psychiatric: No depression at this time.  No anxiety Endocrine: No palpitations, diaphoresis, change in appetite, change in weigh or increased thirst Hematologic/Lymphatic: No anemia, purpura,  petechiae. Allergic/Immunologic: No itchy/runny eyes, nasal congestion, recent allergic reactions, rashes  ALLERGIES: Allergies  Allergen Reactions  . Colophony [Pinus Strobus]   . Formaldehyde   . Propylene Glycol   . Quaternium-15     HOME MEDICATIONS:  Current Outpatient Medications:  .  AMBULATORY NON FORMULARY MEDICATION, To Restore Prosthetics and Orthotics Attn Douglas Leclair Right knee brace for genu recurvatum to assist stability and ambulation. Left ankle-foot orthosis for foot drop to assess stability and ambulation. Duration of need = lifelong., Disp: 1 each, Rfl: 0 .  baclofen (LIORESAL) 20 MG tablet, Take 1 tablet (20 mg total) by mouth 4 (four) times daily., Disp: 360 each, Rfl: 3 .  Biotin 1000 MCG tablet, Take 1,000 mcg by mouth 3 (three) times daily., Disp: , Rfl:  .  CALCIUM-VITAMIN D PO, Take by mouth., Disp: , Rfl:  .  Cholecalciferol (D3-1000) 25 MCG (1000 UT) capsule, Take 1,000 Units by mouth daily., Disp: , Rfl:  .  Clobetasol Propionate (CLOBEX SPRAY) 0.05 % external spray, Clobex 0.05 % topical spray, Disp: , Rfl:  .  CLODERM 0.1 % cream, APPLY TOPICALLY TO THE AFFECTED AREA EVERY DAY, Disp: , Rfl:  .  fluocinonide (LIDEX) 0.05 % external solution, Apply 1 application topically as needed., Disp: , Rfl:  .  Multiple Vitamins-Minerals (MULTIVITAL) tablet, Take 1 tablet by mouth daily., Disp: , Rfl:  .  naproxen (NAPROSYN) 500 MG tablet, Take 1 tablet (500 mg total) by mouth as needed., Disp: 60 tablet, Rfl: 5 .  ocrelizumab 600 mg in sodium chloride 0.9 % 500 mL, Inject 600 mg into  the vein every 6 (six) months., Disp: , Rfl:  .  Omega-3 Fatty Acids (FISH OIL) 1000 MG CAPS, Take by mouth., Disp: , Rfl:  .  omeprazole (PRILOSEC) 40 MG capsule, Take 40 mg by mouth daily., Disp: , Rfl:  .  Probiotic Product (ALIGN PO), Take by mouth., Disp: , Rfl:  .  tamsulosin (FLOMAX) 0.4 MG CAPS capsule, TAKE 1 CAPSULE BY MOUTH EVERY DAY, Disp: 90 capsule, Rfl: 1 .  tiZANidine  (ZANAFLEX) 4 MG tablet, Take 1 to 2 capsules nightly, Disp: 180 tablet, Rfl: 3 .  solifenacin (VESICARE) 5 MG tablet, Take 1 tablet (5 mg total) by mouth daily., Disp: 30 tablet, Rfl: 11  PAST MEDICAL HISTORY: Past Medical History:  Diagnosis Date  . Multiple sclerosis (HCC)   . Psoriasis   . Scoliosis     PAST SURGICAL HISTORY: Past Surgical History:  Procedure Laterality Date  . AUGMENTATION MAMMAPLASTY    . bone navicular  07/05/2006  . FINGER GANGLION CYST EXCISION    . FINGER SURGERY  06/28/2015   capsular release due to injury  . FOOT SURGERY     right and left foot  . FOOT SURGERY    . HEMORRHOIDECTOMY WITH HEMORRHOID BANDING  04/19/2017  . LUMBAR EPIDURAL INJECTION  04/27/2017  . lymph node removal  1976  . REFRACTIVE SURGERY  06/02/1994  . 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 Marital status: Single    Spouse name: Not on file  . Number of children: 0  . Years of education: Masters  . Highest education level: Not on file  Occupational History  . Occupation: unemployed  Tobacco Use  . Smoking status: Former Smoker    Packs/day: 1.00  . Smokeless tobacco: Never Used  Substance and Sexual Activity  . Alcohol use: Yes    Comment: 1 per week  . Drug use: Never  . Sexual activity: Yes    Partners: Male  Other Topics Concern  . Not on file  Social History Narrative   Lives w/ significant other, Sharen HintKevin Fricke   Caffeine use: 2 cups per day   Left handed but transitioning to be right handed d/t being weak in left hand   Social Determinants of Health   Financial Resource Strain:   . Difficulty of Paying Living Expenses: Not on file  Food Insecurity:   . Worried About Programme researcher, broadcasting/film/videounning Out of Food in the Last Year: Not on file  . Ran Out of Food in the Last Year: Not on file   Transportation Needs:   . Lack of Transportation (Medical): Not on file  . Lack of Transportation (Non-Medical): Not on file  Physical Activity:   . Days of Exercise per Week: Not on file  . Minutes of Exercise per Session: Not on file  Stress:   . Feeling of Stress : Not on file  Social Connections:   . Frequency of Communication with Friends and Family: Not on file  . Frequency of Social Gatherings with Friends and Family: Not on file  . Attends Religious Services: Not on file  . Active Member of Clubs or Organizations: Not on file  . Attends BankerClub or Organization Meetings: Not on file  . Marital Status: Not on file  Intimate Partner Violence:   .  Fear of Current or Ex-Partner: Not on file  . Emotionally Abused: Not on file  . Physically Abused: Not on file  . Sexually Abused: Not on file     PHYSICAL EXAM  Vitals:   07/15/19 1533  BP: 125/83  Pulse: 80  Temp: 97.9 F (36.6 C)  TempSrc: Temporal  Weight: 220 lb (99.8 kg)  Height: 5' 6.75" (1.695 m)    Body mass index is 34.72 kg/m.   General: The patient is well-developed and well-nourished and in no acute distress   Neck: The neck is supple, no carotid bruits are noted.  She has mild tenderness midline at the skull base   Skin: Extremities are without rash or edema.  Neurologic Exam  Mental status: The patient is alert and oriented x 3 at the time of the examination. The patient has apparent normal recent and remote memory, with an apparently normal attention span and concentration ability.   Speech is normal.  Cranial nerves: Extraocular movements are full.   There is good facial sensation to soft touch bilaterally.Facial strength is normal.  Trapezius and sternocleidomastoid strength is normal. No dysarthria is noted. No obvious hearing deficits are noted.  Motor:  There is some increased muscle tone in the left leg.. Strength is  5 / 5 on the right.  Strength is 4/5 in the left arm except 3/5 in the  intrinsic hand muscles.  She has atrophy of the intrinsic hand muscles..  Strength is 2/5 in the left iliopsoas, 4/5 in the left quadriceps, 4-/5 in the left hamstrings, not evaluated in the left foot and ankle due to brace .was 2-/5 last visit.  Sensory: She has intact sensation to touch and vibration in the limbs  Coordination: Cerebellar testing reveals reduced left finger-nose-finger and can't do left heel-to-shin .  Gait and station: Station is normal.  She is wearing a left AFO brace.  Very poor gait due to the left weakness..  She is unable to do a tandem walk.  The Romberg is borderline..   Reflexes: Deep tendon reflexes are symmetric and increased on the left relative to the right.   Plantar responses are flexor on the right and extensor on the left.  ______________________________________________________  ASSESSMENT AND PLAN  1. Multiple sclerosis ([place])   2. 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 Chiari malformation type I ([place])   5. Other fatigue   6. Urinary hesitancy     1.   She will continue Ocrevus for her relapsing remitting MS.   Her next infusion is in May.    2.   Ok to take [employer] or [employer] vaccine 3.  She will return to see me in 6 months but sooner if there are new or worsening neurologic symptoms.    Levern Pitter A. Felecia Shelling, MD, PhD, FAAN Certified in Neurology, Clinical Neurophysiology, Sleep Medicine, Pain Medicine and Neuroimaging Director, [place] [facility] [address] [place],  [address] [phone]

## 2019-07-17 ENCOUNTER — Other Ambulatory Visit: Payer: Self-pay

## 2019-07-17 ENCOUNTER — Ambulatory Visit (INDEPENDENT_AMBULATORY_CARE_PROVIDER_SITE_OTHER): Payer: BLUE CROSS/BLUE SHIELD | Admitting: Physician Assistant

## 2019-07-17 VITALS — Temp 98.3°F

## 2019-07-17 DIAGNOSIS — Z23 Encounter for immunization: Secondary | ICD-10-CM

## 2019-07-17 NOTE — Progress Notes (Signed)
   Subjective:    Patient ID: Meredith Hale, female    DOB: [birth year], 58 y.o.   MRN: [id number]  HPI Patient here in office for 2nd Shingrix vaccine. Patient tolerated well without any complications. KG LPN   Review of Systems     Objective:   Physical Exam        Assessment & Plan:

## 2019-07-24 ENCOUNTER — Ambulatory Visit: Payer: BLUE CROSS/BLUE SHIELD | Admitting: Neurology

## 2019-08-07 ENCOUNTER — Other Ambulatory Visit: Payer: Self-pay | Admitting: *Deleted

## 2019-08-07 ENCOUNTER — Telehealth: Payer: Self-pay | Admitting: Neurology

## 2019-08-07 ENCOUNTER — Telehealth: Payer: Self-pay

## 2019-08-07 MED ORDER — MODAFINIL 200 MG PO TABS: 200.0000 mg | ORAL_TABLET | Freq: Every day | ORAL | 1 refills | Status: DC

## 2019-08-07 MED ORDER — NAPROXEN 500 MG PO TABS: 500.0000 mg | ORAL_TABLET | ORAL | 0 refills | Status: DC | PRN

## 2019-08-07 MED ORDER — NAPROXEN 500 MG PO TABS: 500.0000 mg | ORAL_TABLET | Freq: Two times a day (BID) | ORAL | 0 refills | Status: DC | PRN

## 2019-08-07 MED ORDER — SOLIFENACIN SUCCINATE 5 MG PO TABS: 5.0000 mg | ORAL_TABLET | Freq: Every day | ORAL | 3 refills | Status: DC

## 2019-08-07 NOTE — Progress Notes (Signed)
I called pt back. She thinks her insurance will no allow coverage of modafinil. Requesting this be e-scribed to [facility]. Advised Dr. Epimenio Foot out of office until Monday but I will send request to Dr. Anne Hahn who is covering. It may require PA which I can work on next week if needed. She would like a call with an update once I know more next week.   Checked drug registry. Last refill of modafinil is showing 10/01/2017 #90.   The prescription for the Provigil was sent in.

## 2019-08-07 NOTE — Telephone Encounter (Signed)
I escribed new rx and asked other rx's for naproxen be d/c'd.

## 2019-08-07 NOTE — Telephone Encounter (Signed)
Called, LVM returning pt call 

## 2019-08-07 NOTE — Telephone Encounter (Signed)
Patient called to request her medication be sent over to her new pharmacy due to insurance coverage   Med: nadafanil 200mg   Pharmacy: [facility]  Phone#[phone]   Patient has requested a call back.

## 2019-08-07 NOTE — Telephone Encounter (Signed)
Lorelle Formosa from [facility] called and LVM stating that they received a prescription for the pt's naproxen (NAPROSYN) 500 MG tablet but there was no frequency on there whether it was for once a day or twice a day, it only states by mouth as needed. They are needing a verbal call back with clarification at [phone] or it can be re escribed with the dosage clarification. [id number] Please advise.

## 2019-08-07 NOTE — Telephone Encounter (Signed)
Took call from phone staff and spoke with pt. She thinks her insurance will no allow coverage of modafinil. Requesting this be e-scribed to [facility]. Advised Dr. Epimenio Foot out of office until Monday but I will send request to Dr. Anne Hahn who is covering. It may require PA which I can work on next week if needed. She would like a call with an update once I know more next week.    Checked drug registry. Last refill of modafinil is showing 10/01/2017 #90.  I sent request to Dr. Anne Hahn since Dr. Epimenio Foot out.

## 2019-08-11 NOTE — Telephone Encounter (Signed)
Pt has called stating that [facility]  Is requesting a PA on modafinil (PROVIGIL) 200 MG tablet &  solifenacin (VESICARE) 5 MG tablet

## 2019-08-11 NOTE — Telephone Encounter (Signed)
Noted, I will work on this for pt.

## 2019-08-12 NOTE — Telephone Encounter (Addendum)
Pt uploaded new insurance cards. ID: [id number]. RxBIN: [id number]. RxPCN: [id number]. RXgrp: [id number].   Submitted PA modafinil on CMM.KeyLudwig Lean - PA Case ID: [id number]. Waiting on determination from [facility].  Submitted PA vesicare on CMM.  KeyJarold Motto - PA Case ID: [id number]. Waiting on determination.

## 2019-08-12 NOTE — Telephone Encounter (Addendum)
Tried initiating PA's on CMM with [facility] we have on file but it came back stating not eligibility found.  I called pt. She changed her insurance. She provided copy to [place], RN with intrafusion but not [place]. She will log on to [employer] and upload copy of her insurance cards in the next 30 min so I can view that.

## 2019-08-13 ENCOUNTER — Telehealth: Payer: Self-pay | Admitting: *Deleted

## 2019-08-13 MED ORDER — AMPHETAMINE-DEXTROAMPHETAMINE 10 MG PO TABS: 10.0000 mg | ORAL_TABLET | Freq: Two times a day (BID) | ORAL | 0 refills | Status: DC

## 2019-08-13 MED ORDER — OXYBUTYNIN CHLORIDE ER 5 MG PO TB24: 5.0000 mg | ORAL_TABLET | Freq: Every day | ORAL | 5 refills | Status: DC

## 2019-08-13 NOTE — Telephone Encounter (Signed)
Received the following determinations from the patient's insurance plan:  1) Denied coverage of modafinil. Trial and failure or intolerance to amantadine OR a Central Nervous System (CNS) stimulant (for example: methylphenidate, amphetamine salts).  Per vo by Dr. Epimenio Foot, provide rx for amphetamine salts 10mg , one tablet BID. #60 x 0.  2) Denied coverage of solifenacin. Step one drugs that are covered by this plan include: Oxytrol over the counter, oxybutynin, oxybutynin-ER or oxybutynin syrup.  Per vo by Dr. , provide rx for oxbutynin-ER 10mg , one tablet daily. #30 x 5.  Pt has coverage with [facility] [phone]). Pt .  I called the patient and she is agreeable to both recommended medication changes.

## 2019-08-13 NOTE — Telephone Encounter (Signed)
Patient called to update her pharmacy. States she will need her medications sent to:  [facility] [phone]

## 2019-08-13 NOTE — Telephone Encounter (Signed)
I called pt. Advised adderall has to [facility] filled [facility] local [facility] d/t being controlled and since oxybutynin a new med for her I recommended [facility] and try it before we send 90days supply to [facility]. She is agreeable to this plan. [facility] to provide her updated insurance information. Nothing further needed.

## 2019-08-19 DIAGNOSIS — Z0289 Encounter for other administrative examinations: Secondary | ICD-10-CM

## 2019-09-03 ENCOUNTER — Other Ambulatory Visit: Payer: 59 | Admitting: Orthotics

## 2019-09-03 ENCOUNTER — Other Ambulatory Visit: Payer: Self-pay

## 2019-09-16 ENCOUNTER — Telehealth: Payer: Self-pay | Admitting: Neurology

## 2019-09-16 MED ORDER — SOLIFENACIN SUCCINATE 5 MG PO TABS: 5.0000 mg | ORAL_TABLET | Freq: Every day | ORAL | 1 refills | Status: DC

## 2019-09-16 MED ORDER — MODAFINIL 200 MG PO TABS: 200.0000 mg | ORAL_TABLET | Freq: Every day | ORAL | 1 refills | Status: DC

## 2019-09-16 NOTE — Telephone Encounter (Signed)
Pt is asking for a call from Kara Mead, RN to discuss her 2 medications that require a prior authorization:amphetamine-dextroamphetamine (ADDERALL) 10 MG tablet & oxybutynin (DITROPAN-XL) 5 MG 24 hr tablet

## 2019-09-16 NOTE — Addendum Note (Signed)
Addended by: Despina Arias A on: 09/16/2019 04:08 PM   Modules accepted: Orders

## 2019-09-16 NOTE — Addendum Note (Signed)
Addended by: Arther Abbott on: 09/16/2019 12:21 PM   Modules accepted: Orders

## 2019-09-16 NOTE — Telephone Encounter (Addendum)
I called pharmacy and spoke with Debby Bud. He states oxybutynin 5mg  tab ready for pick up. Cost: $12.50. No PA needed.  The adderall 10mg  tab does not require PA. However, refill is needed. Cost: $5.00.  I checked drug registry. She last refilled 08/14/2019 #60. She was last seen 07/15/2019 and next f/u 01/13/2020.  I called pt. She is experiencing dizziness on oxybuytnin. She would like to try and get back on Solifenacin and she will use goodrx coupon for this instead of her insurance. She would like this to go to 07/17/2019 [address] [place], [place], [place] for 90 days supply.  She is also not tolerating adderall, makes her jittery. She wants to switch back to modafinil and will use insurance. She is aware another PA may be needed to get this approved. She would like this to go to [facility] if Dr. [place] approves this. [facility]   I placed her on hold and spoke with MD. He approved the above requests. Ok to send modafinil 90days supply with 1 refill per MD.  [facility] adderall and oxybutynin on file. Spoke with [phone].

## 2019-09-18 NOTE — Telephone Encounter (Signed)
Submitted PA modafinil on CMM. Key: [id number] - PA Case ID: [id number]. Waiting on determination from [facility].

## 2019-09-22 NOTE — Telephone Encounter (Signed)
Received fax from [facility] that PA approved 09/18/19-09/17/20.

## 2019-10-15 ENCOUNTER — Other Ambulatory Visit: Payer: Self-pay | Admitting: Neurology

## 2019-10-23 ENCOUNTER — Encounter: Payer: Self-pay | Admitting: Nurse Practitioner

## 2019-10-23 ENCOUNTER — Telehealth (INDEPENDENT_AMBULATORY_CARE_PROVIDER_SITE_OTHER): Payer: 59 | Admitting: Nurse Practitioner

## 2019-10-23 VITALS — Wt 209.0 lb

## 2019-10-23 DIAGNOSIS — N3090 Cystitis, unspecified without hematuria: Secondary | ICD-10-CM | POA: Diagnosis not present

## 2019-10-23 MED ORDER — CIPROFLOXACIN HCL 250 MG PO TABS: 250.0000 mg | ORAL_TABLET | Freq: Two times a day (BID) | ORAL | 0 refills | Status: DC

## 2019-10-23 NOTE — Patient Instructions (Signed)
Urinary Tract Infection, Adult A urinary tract infection (UTI) is an infection of any part of the urinary tract. The urinary tract includes:  The kidneys.  The ureters.  The bladder.  The urethra. These organs make, store, and get rid of pee (urine) in the body. What are the causes? This is caused by germs (bacteria) in your genital area. These germs grow and cause swelling (inflammation) of your urinary tract. What increases the risk? You are more likely to develop this condition if:  You have a small, thin tube (catheter) to drain pee.  You cannot control when you pee or poop (incontinence).  You are female, and: ? You use these methods to prevent pregnancy:  A medicine that kills sperm (spermicide).  A device that blocks sperm (diaphragm). ? You have low levels of a female hormone (estrogen). ? You are pregnant.  You have genes that add to your risk.  You are sexually active.  You take antibiotic medicines.  You have trouble peeing because of: ? A prostate that is bigger than normal, if you are female. ? A blockage in the part of your body that drains pee from the bladder (urethra). ? A kidney stone. ? A nerve condition that affects your bladder (neurogenic bladder). ? Not getting enough to drink. ? Not peeing often enough.  You have other conditions, such as: ? Diabetes. ? A weak disease-fighting system (immune system). ? Sickle cell disease. ? Gout. ? Injury of the spine. What are the signs or symptoms? Symptoms of this condition include:  Needing to pee right away (urgently).  Peeing often.  Peeing small amounts often.  Pain or burning when peeing.  Blood in the pee.  Pee that smells bad or not like normal.  Trouble peeing.  Pee that is cloudy.  Fluid coming from the vagina, if you are female.  Pain in the belly or lower back. Other symptoms include:  Throwing up (vomiting).  No urge to eat.  Feeling mixed up (confused).  Being tired  and grouchy (irritable).  A fever.  Watery poop (diarrhea). How is this treated? This condition may be treated with:  Antibiotic medicine.  Other medicines.  Drinking enough water. Follow these instructions at home:  Medicines  Take over-the-counter and prescription medicines only as told by your doctor.  If you were prescribed an antibiotic medicine, take it as told by your doctor. Do not stop taking it even if you start to feel better. General instructions  Make sure you: ? Pee until your bladder is empty. ? Do not hold pee for a long time. ? Empty your bladder after sex. ? Wipe from front to back after pooping if you are a female. Use each tissue one time when you wipe.  Drink enough fluid to keep your pee pale yellow.  Keep all follow-up visits as told by your doctor. This is important. Contact a doctor if:  You do not get better after 1-2 days.  Your symptoms go away and then come back. Get help right away if:  You have very bad back pain.  You have very bad pain in your lower belly.  You have a fever.  You are sick to your stomach (nauseous).  You are throwing up. Summary  A urinary tract infection (UTI) is an infection of any part of the urinary tract.  This condition is caused by germs in your genital area.  There are many risk factors for a UTI. These include having a small, thin   tube to drain pee and not being able to control when you pee or poop.  Treatment includes antibiotic medicines for germs.  Drink enough fluid to keep your pee pale yellow. This information is not intended to replace advice given to you by your health care provider. Make sure you discuss any questions you have with your health care provider. Document Revised: 06/20/2018 Document Reviewed: 01/10/2018 Elsevier Patient Education  2020 [employer]  

## 2019-10-23 NOTE — Progress Notes (Signed)
Virtual Visit via MyChart Note  I connected with  Meredith Hale on 10/23/19 at 10:30 AM EDT by the video enabled telemedicine application, MyChart, and verified that I am speaking with the correct person using two identifiers.   I introduced myself as a Publishing rights manager with the practice. We discussed the limitations of evaluation and management by telemedicine and the availability of in person appointments. The patient expressed understanding and agreed to proceed.  The patient is: at home I am: in the office  Subjective:    CC: UTI  HPI: Meredith Hale is a 59 y.o. y/o female presenting via MyChart today for symptoms of dysuria, urinary urgency, and cloudy urine for 3-4 days. She takes an infusion for her MS Meredith Hale) every 6 months, which decreases her immune system and makes her more susceptible to UTI's. She comes down with a UTI about once a year due to this and she reports her symptoms are always consistent with what she is experiencing now. She does report that she required a stronger antibiotic in the past to clear the UTI, but she is unable to remember the name.   She denies pelvic pain, bladder spasms, back pain, fever, abdominal pain, nausea, vomiting, or chills.    Past medical history, Surgical history, Family history not pertinant except as noted below, Social history, Allergies, and medications have been entered into the medical record, reviewed, and corrections made.   Review of Systems:  See HPI for pertinent positive and negatives  Objective:    General: Speaking clearly in complete sentences without any shortness of breath.  Alert and oriented x3.  Normal judgment. No apparent acute distress.   Impression and Recommendations:    1. Cystitis Symptoms and presentation consistent with acute cystitis. Given patients significant history with urinary tract infections post infusion and need for stronger antimicrobial treatment, we will treat with a 5 day course of  Ciprofloxacin.  Script sent to pharmacy.  Patient provided with instructions and information on UTI. Patient to contact the office is symptoms worsen or fail to improve.  - ciprofloxacin (CIPRO) 250 MG tablet; Take 1 tablet (250 mg total) by mouth 2 (two) times daily.  Dispense: 10 tablet; Refill: 0    I discussed the assessment and treatment plan with the patient. The patient was provided an opportunity to ask questions and all were answered. The patient agreed with the plan and demonstrated an understanding of the instructions.   The patient was advised to call back or seek an in-person evaluation if the symptoms worsen or if the condition fails to improve as anticipated.  I provided 15 minutes of non-face-to-face interaction with this MYCHART visit.   Tollie Eth, NP

## 2019-11-21 ENCOUNTER — Telehealth: Payer: Self-pay

## 2019-11-21 ENCOUNTER — Ambulatory Visit: Payer: 59 | Admitting: Family Medicine

## 2019-11-21 ENCOUNTER — Encounter: Payer: Self-pay | Admitting: Family Medicine

## 2019-11-21 ENCOUNTER — Other Ambulatory Visit: Payer: Self-pay

## 2019-11-21 VITALS — BP 126/74 | HR 70 | Ht 67.0 in | Wt 226.0 lb

## 2019-11-21 DIAGNOSIS — L409 Psoriasis, unspecified: Secondary | ICD-10-CM | POA: Diagnosis not present

## 2019-11-21 DIAGNOSIS — R21 Rash and other nonspecific skin eruption: Secondary | ICD-10-CM

## 2019-11-21 DIAGNOSIS — N3000 Acute cystitis without hematuria: Secondary | ICD-10-CM

## 2019-11-21 DIAGNOSIS — R300 Dysuria: Secondary | ICD-10-CM

## 2019-11-21 LAB — POCT URINALYSIS DIP (CLINITEK)
Bilirubin, UA: NEGATIVE
Glucose, UA: NEGATIVE mg/dL
Ketones, POC UA: NEGATIVE mg/dL
Nitrite, UA: NEGATIVE
POC PROTEIN,UA: NEGATIVE
Spec Grav, UA: 1.015 (ref 1.010–1.025)
Urobilinogen, UA: 0.2 E.U./dL
pH, UA: 5.5 (ref 5.0–8.0)

## 2019-11-21 MED ORDER — NITROFURANTOIN MONOHYD MACRO 100 MG PO CAPS: 100.0000 mg | ORAL_CAPSULE | Freq: Two times a day (BID) | ORAL | 0 refills | Status: DC

## 2019-11-21 NOTE — Progress Notes (Addendum)
Acute Office Visit  Subjective:    Patient ID: Meredith Hale, female    DOB: 03/12/1961, 59 y.o.   MRN: [id number]  Chief Complaint  Patient presents with  . Urinary Tract Infection    HPI Patient is in today for dysuria.  She was actually treated for it via a virtual visit about 4 weeks ago for urinary symptoms which she gets intermittently.  She was treated with Cipro and says she did feel better for about a week and then felt like she was starting to have some urgency and dysuria again.  She denies any fevers chills or sweats.  No blood in the stool.  She is never had any prior diagnosis of pyelonephritis.  No recent back pain.  She just wonders if the original infection was not completely resolved.  She also wanted to talk to me about her skin she has had a erythematous scaling red facial rash for quite some time in fact she is actually seen dermatology and the allergist.  She is tried topical steroids and more recently Elidel with her allergist.  So far she prefers the Elidel over the steroid and says that it does help but it never completely clears up the rash.  She does have a history of psoriasis and was on Enbrel at one point.  But she was diagnosed with MS about 6 months after starting Enbrel so is very leery of Biologics.  Past Medical History:  Diagnosis Date  . Multiple sclerosis ([place])   . Psoriasis   . Scoliosis     Past Surgical History:  Procedure Laterality Date  . AUGMENTATION MAMMAPLASTY    . bone navicular  07/05/2006  . FINGER GANGLION CYST EXCISION    . FINGER SURGERY  06/28/2015   capsular release due to injury  . FOOT SURGERY     right and left foot  . FOOT SURGERY    . HEMORRHOIDECTOMY WITH HEMORRHOID BANDING  04/19/2017  . LUMBAR EPIDURAL INJECTION  04/27/2017  . lymph node removal  1976  . REFRACTIVE SURGERY  06/02/1994  . 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 Marital status: Single    Spouse name: Not on file  . Number of children: 0  . Years of education: Masters  . Highest education level: Not on file  Occupational History  . Occupation: unemployed  Tobacco Use  . Smoking status: Former Smoker    Packs/day: 1.00  . Smokeless tobacco: Never Used  Substance and Sexual Activity  . Alcohol use: Yes    Comment: 1 per week  . Drug use: Never  . Sexual activity: Yes    Partners: Male  Other Topics Concern  . Not on file  Social History Narrative   Lives w/ significant other, Forrest Moron   Caffeine use: 2 cups per day   Left handed but transitioning to be right handed d/t being weak in left hand   Social Determinants of Health   Financial Resource Strain:   . Difficulty of Paying Living Expenses:   Food Insecurity:   . Worried About Charity fundraiser in the Last Year:   . Arboriculturist in the Last Year:   Transportation Needs:   . Film/video editor (Medical):   Marland Kitchen Lack of Transportation (Non-Medical):  Physical Activity:   . Days of Exercise per Week:   . Minutes of Exercise per Session:   Stress:   . Feeling of Stress :   Social Connections:   . Frequency of Communication with Friends and Family:   . Frequency of Social Gatherings with Friends and Family:   . Attends Religious Services:   . Active Member of Clubs or Organizations:   . Attends Archivist Meetings:   Marland Kitchen Marital Status:   Intimate Partner Violence:   . Fear of Current or Ex-Partner:   . Emotionally Abused:   Marland Kitchen Physically Abused:   . Sexually Abused:     Outpatient Medications Prior to Visit  Medication Sig Dispense Refill  . AMBULATORY NON FORMULARY MEDICATION To [facility] Attn Douglas Leclair Right knee brace for genu recurvatum to assist stability and ambulation. Left ankle-foot orthosis for foot drop to assess stability and  ambulation. Duration of need = lifelong. 1 each 0  . baclofen (LIORESAL) 20 MG tablet Take 1 tablet by mouth 4 times a day 360 tablet 0  . Biotin 1000 MCG tablet Take 1,000 mcg by mouth daily.     Marland Kitchen CALCIUM-VITAMIN D PO Take by mouth.    . Cholecalciferol (D3-1000) 25 MCG (1000 UT) capsule Take 1,000 Units by mouth daily.    . Clobetasol Propionate (CLOBEX SPRAY) 0.05 % external spray Clobex 0.05 % topical spray    . CLODERM 0.1 % cream APPLY TOPICALLY TO THE AFFECTED AREA EVERY DAY    . fluocinonide (LIDEX) 0.05 % external solution Apply 1 application topically as needed.    . modafinil (PROVIGIL) 200 MG tablet Take 1 tablet (200 mg total) by mouth daily. 90 tablet 1  . Multiple Vitamins-Minerals (MULTIVITAL) tablet Take 1 tablet by mouth daily.    . naproxen (NAPROSYN) 500 MG tablet Take 1 tablet (500 mg total) by mouth 2 (two) times daily as needed. 180 tablet 0  . ocrelizumab 600 mg in sodium chloride 0.9 % 500 mL Inject 600 mg into the vein every 6 (six) months.    . Omega-3 Fatty Acids (FISH OIL) 1000 MG CAPS Take by mouth.    Marland Kitchen omeprazole (PRILOSEC) 40 MG capsule Take 40 mg by mouth as needed.     . Probiotic Product (ALIGN PO) Take by mouth.    . solifenacin (VESICARE) 5 MG tablet Take 1 tablet (5 mg total) by mouth daily. 90 tablet 1  . tamsulosin (FLOMAX) 0.4 MG CAPS capsule Take 1 capsule by mouth every day 90 capsule 0  . tiZANidine (ZANAFLEX) 4 MG tablet Take 1 to 2 capsules nightly 180 tablet 3  . ciprofloxacin (CIPRO) 250 MG tablet Take 1 tablet (250 mg total) by mouth 2 (two) times daily. 10 tablet 0   No facility-administered medications prior to visit.    Allergies  Allergen Reactions  . Colophony [Pinus Strobus]   . Formaldehyde   . Propylene Glycol   . Quaternium-15     Review of Systems     Objective:    Physical Exam Vitals reviewed.  Constitutional:      Appearance: She is well-developed.  HENT:     Head: Normocephalic and atraumatic.  Eyes:      Conjunctiva/sclera: Conjunctivae normal.  Cardiovascular:     Rate and Rhythm: Normal rate.  Pulmonary:     Effort: Pulmonary effort is normal.  Skin:    General: Skin is dry.     Coloration: Skin is not pale.  Comments: Erythematous dry scaling diffuse rash over her forehead cheeks and chin.  She also has a little patch over her left eyelid.  Neurological:     Mental Status: She is alert and oriented to person, place, and time.  Psychiatric:        Behavior: Behavior normal.     BP 126/74   Pulse 70   Ht 5' 7"  (1.702 m)   Wt 226 lb (102.5 kg)   SpO2 98%   BMI 35.40 kg/m  Wt Readings from Last 3 Encounters:  11/21/19 226 lb (102.5 kg)  10/23/19 209 lb (94.8 kg)  07/15/19 220 lb (99.8 kg)    There are no preventive care reminders to display for this patient.  There are no preventive care reminders to display for this patient.   Lab Results  Component Value Date   TSH 2.400 01/21/2019   Lab Results  Component Value Date   WBC 7.6 01/21/2019   HGB 12.4 01/21/2019   HCT 37.8 01/21/2019   MCV 88 01/21/2019   PLT 299 01/21/2019   No results found for: NA, K, CHLORIDE, CO2, GLUCOSE, BUN, CREATININE, BILITOT, ALKPHOS, AST, ALT, PROT, ALBUMIN, CALCIUM, ANIONGAP, EGFR, GFR No results found for: CHOL No results found for: HDL No results found for: LDLCALC No results found for: TRIG No results found for: CHOLHDL No results found for: HGBA1C     Assessment & Plan:   Problem List Items Addressed This Visit      Musculoskeletal and Integument   Psoriasis    Other Visit Diagnoses    Dysuria    -  Primary   Relevant Orders   POCT URINALYSIS DIP (CLINITEK) (Completed)   Urine Culture   Acute cystitis without hematuria       Rash         UTI-urinalysis looks most consistent with UTI we will go ahead and treat with Macrobid for now.  Will send urine culture for confirmation since she did not completely resolve on the initial antibiotic.  Will call with results  once available.  Rash-it looks very different from her psoriasis but I still think it could be related and or a more severe form of seborrheic dermatitis.  Consider consultation with another dermatologist for potential options but it may include Biologics which she again is a little bit leery about trying.  Meds ordered this encounter  Medications  . nitrofurantoin, macrocrystal-monohydrate, (MACROBID) 100 MG capsule    Sig: Take 1 capsule (100 mg total) by mouth 2 (two) times daily.    Dispense:  10 capsule    Refill:  0   Time spent 25 min in encounter    Beatrice Lecher, MD

## 2019-11-21 NOTE — Telephone Encounter (Signed)
Pt called and LVM stating that she finished the atbx that was given to her on during her 10/23/2019 video visit but she is still having sx of urgency and cloudy urine. Spoke to pt and advised that if she finished the atbx and was still having sx that she would need to come in for an OV so that we could do a urinalysis. Pt agreeable to OV. Call transferred to the front desk for scheduling.

## 2019-11-23 LAB — URINE CULTURE
MICRO NUMBER:: 10454806
SPECIMEN QUALITY:: ADEQUATE

## 2019-11-25 ENCOUNTER — Telehealth: Payer: Self-pay

## 2019-11-25 NOTE — Telephone Encounter (Signed)
Jamice called and left a message asking about the urine culture results.

## 2019-12-08 ENCOUNTER — Telehealth: Payer: Self-pay

## 2019-12-08 NOTE — Telephone Encounter (Signed)
She should contact whoever is prescribing the medication (ocrelizumab? --> neurology) to ensure this is necessary - if they think it's needed, they need to order the test since they are managing the medication.

## 2019-12-08 NOTE — Telephone Encounter (Signed)
Patient advised of recommendations.  

## 2019-12-08 NOTE — Telephone Encounter (Signed)
Meredith Hale states she is on a medication that involves the B cells. Due to this she states she needs a COVID-19 antibody test to make sure she has immunity. Please advise.

## 2019-12-18 ENCOUNTER — Other Ambulatory Visit: Payer: Self-pay | Admitting: *Deleted

## 2019-12-18 MED ORDER — BACLOFEN 20 MG PO TABS: 20.0000 mg | ORAL_TABLET | Freq: Four times a day (QID) | ORAL | 1 refills | Status: DC

## 2019-12-18 MED ORDER — TIZANIDINE HCL 4 MG PO TABS: ORAL_TABLET | ORAL | 1 refills | Status: DC

## 2019-12-18 MED ORDER — TAMSULOSIN HCL 0.4 MG PO CAPS: 0.4000 mg | ORAL_CAPSULE | Freq: Every day | ORAL | 1 refills | Status: DC

## 2019-12-30 ENCOUNTER — Telehealth: Payer: Self-pay | Admitting: Neurology

## 2019-12-30 ENCOUNTER — Other Ambulatory Visit: Payer: Self-pay | Admitting: Osteopathic Medicine

## 2019-12-30 DIAGNOSIS — Z1231 Encounter for screening mammogram for malignant neoplasm of breast: Secondary | ICD-10-CM

## 2019-12-30 NOTE — Telephone Encounter (Signed)
Pt is asking for the order for her lab work so results can be available for upcoming appointment.  Please call to discuss.

## 2019-12-31 ENCOUNTER — Other Ambulatory Visit: Payer: Self-pay | Admitting: Neurology

## 2019-12-31 DIAGNOSIS — Z79899 Other long term (current) drug therapy: Secondary | ICD-10-CM

## 2019-12-31 DIAGNOSIS — G35 Multiple sclerosis: Secondary | ICD-10-CM

## 2019-12-31 NOTE — Telephone Encounter (Signed)
I called the pt and left vm ( ok per dpr) advising labs have been placed and she can come during normal lab hours at our office ( 8-5 Monday-Thursday) to have labs drawn.  Pt was advised to call back if she had any questions.

## 2019-12-31 NOTE — Telephone Encounter (Signed)
Dr. Epimenio Foot, would you be ok with drawing labs before her 01/10/2020 f/u appointment? Pt would like labs to completed so she can discuss during o/v. Thanks!

## 2019-12-31 NOTE — Telephone Encounter (Signed)
Ok   I placed the orders for her labs   she can come in to get them done

## 2020-01-07 ENCOUNTER — Other Ambulatory Visit (INDEPENDENT_AMBULATORY_CARE_PROVIDER_SITE_OTHER): Payer: Self-pay

## 2020-01-07 ENCOUNTER — Ambulatory Visit: Payer: 59 | Admitting: Physician Assistant

## 2020-01-07 ENCOUNTER — Other Ambulatory Visit: Payer: Self-pay | Admitting: *Deleted

## 2020-01-07 DIAGNOSIS — Z0289 Encounter for other administrative examinations: Secondary | ICD-10-CM

## 2020-01-07 DIAGNOSIS — R799 Abnormal finding of blood chemistry, unspecified: Secondary | ICD-10-CM

## 2020-01-07 DIAGNOSIS — G35 Multiple sclerosis: Secondary | ICD-10-CM

## 2020-01-07 DIAGNOSIS — Z79899 Other long term (current) drug therapy: Secondary | ICD-10-CM

## 2020-01-08 ENCOUNTER — Ambulatory Visit
Admission: RE | Admit: 2020-01-08 | Discharge: 2020-01-08 | Disposition: A | Discharge status: Home / Self Care | Payer: 59 | Source: Ambulatory Visit | Attending: Osteopathic Medicine | Admitting: Osteopathic Medicine

## 2020-01-08 ENCOUNTER — Other Ambulatory Visit: Payer: Self-pay

## 2020-01-08 DIAGNOSIS — Z1231 Encounter for screening mammogram for malignant neoplasm of breast: Secondary | ICD-10-CM

## 2020-01-08 LAB — IGG, IGA, IGM
IgA/Immunoglobulin A, Serum: 314 mg/dL (ref 87–352)
IgG (Immunoglobin G), Serum: 1009 mg/dL (ref 586–1602)
IgM (Immunoglobulin M), Srm: 81 mg/dL (ref 26–217)

## 2020-01-08 LAB — CBC WITH DIFFERENTIAL/PLATELET
Basophils Absolute: 0 10*3/uL (ref 0.0–0.2)
Basos: 1 %
EOS (ABSOLUTE): 0.1 10*3/uL (ref 0.0–0.4)
Eos: 2 %
Hematocrit: 42.2 % (ref 34.0–46.6)
Hemoglobin: 14.1 g/dL (ref 11.1–15.9)
Immature Grans (Abs): 0 10*3/uL (ref 0.0–0.1)
Immature Granulocytes: 1 %
Lymphocytes Absolute: 0.8 10*3/uL (ref 0.7–3.1)
Lymphs: 10 %
MCH: 29.4 pg (ref 26.6–33.0)
MCHC: 33.4 g/dL (ref 31.5–35.7)
MCV: 88 fL (ref 79–97)
Monocytes Absolute: 0.6 10*3/uL (ref 0.1–0.9)
Monocytes: 9 %
Neutrophils Absolute: 5.9 10*3/uL (ref 1.4–7.0)
Neutrophils: 77 %
Platelets: 318 10*3/uL (ref 150–450)
RBC: 4.79 x10E6/uL (ref 3.77–5.28)
RDW: 13.4 % (ref 11.7–15.4)
WBC: 7.5 10*3/uL (ref 3.4–10.8)

## 2020-01-08 LAB — HEPATIC FUNCTION PANEL
ALT: 86 IU/L — ABNORMAL HIGH (ref 0–32)
AST: 51 IU/L — ABNORMAL HIGH (ref 0–40)
Albumin: 4.6 g/dL (ref 3.8–4.9)
Alkaline Phosphatase: 103 IU/L (ref 48–121)
Bilirubin Total: 0.3 mg/dL (ref 0.0–1.2)
Bilirubin, Direct: 0.12 mg/dL (ref 0.00–0.40)
Total Protein: 7.3 g/dL (ref 6.0–8.5)

## 2020-01-09 ENCOUNTER — Ambulatory Visit (INDEPENDENT_AMBULATORY_CARE_PROVIDER_SITE_OTHER): Payer: 59 | Admitting: Neurology

## 2020-01-09 ENCOUNTER — Encounter: Payer: Self-pay | Admitting: Podiatry

## 2020-01-09 ENCOUNTER — Encounter: Payer: Self-pay | Admitting: Neurology

## 2020-01-09 ENCOUNTER — Ambulatory Visit (INDEPENDENT_AMBULATORY_CARE_PROVIDER_SITE_OTHER): Payer: 59 | Admitting: Podiatry

## 2020-01-09 VITALS — BP 113/71 | HR 71 | Ht 67.0 in | Wt 217.0 lb

## 2020-01-09 DIAGNOSIS — G8114 Spastic hemiplegia affecting left nondominant side: Secondary | ICD-10-CM

## 2020-01-09 DIAGNOSIS — R5383 Other fatigue: Secondary | ICD-10-CM

## 2020-01-09 DIAGNOSIS — Q666 Other congenital valgus deformities of feet: Secondary | ICD-10-CM

## 2020-01-09 DIAGNOSIS — M779 Enthesopathy, unspecified: Secondary | ICD-10-CM

## 2020-01-09 DIAGNOSIS — G35 Multiple sclerosis: Secondary | ICD-10-CM | POA: Diagnosis not present

## 2020-01-09 DIAGNOSIS — M19079 Primary osteoarthritis, unspecified ankle and foot: Secondary | ICD-10-CM

## 2020-01-09 DIAGNOSIS — R269 Unspecified abnormalities of gait and mobility: Secondary | ICD-10-CM

## 2020-01-09 DIAGNOSIS — M7751 Other enthesopathy of right foot: Secondary | ICD-10-CM

## 2020-01-09 DIAGNOSIS — R29898 Other symptoms and signs involving the musculoskeletal system: Secondary | ICD-10-CM

## 2020-01-09 MED ORDER — BUPROPION HCL ER (XL) 300 MG PO TB24
300.0000 mg | ORAL_TABLET | Freq: Every day | ORAL | 3 refills | Status: DC
Start: 2020-01-09 — End: 2020-11-11

## 2020-01-09 NOTE — Progress Notes (Signed)
[facility]  PATIENT: Meredith Hale DOB: May 31, 1961  REFERRING DOCTOR OR PCP: Dr. Merita Norton [facility] neurology) SOURCE: Patient, notes from Dr. Manson Passey, imaging and lab reports   _________________________________   HISTORICAL  CHIEF COMPLAINT:  Chief Complaint  Patient presents with  . Follow-up    pt alone, rm 12. pt states that she is continuing to loose fine motor skills in the left hand and left arm weakness    HISTORY OF PRESENT ILLNESS:  Meredith Hale is a 59 year old woman with relapsing remitting MS diagnosed in 2006 though she had a Lhermitte sign in 1999.    Update 01/09/2020 She is on Ocrevus and she tolerates it well but the LFTs were increased last week.  We discussed repeating the LFTs sometime the next month.  She has not had any exacerbations while on Ocrevus.  However, she has had some progression of symptoms with worsening gait.  She uses a cane and has reduced gait with some falls.   A crutch helps better than a standard cane which she uses more as easier to take with her.   She has lost fine motor skill dexterity in her dominant left hand.   Writing and preparing skills is more difficult.   Left leg also affected more .   Due to the spasticity on the left side she takes baclofen and tizanidine with benefit.  She also has urinary urgency.  Review of the last brain MRI shows she has several T2 hyperintense foci in her spinal cord  She has fatigue, worse some days than others..   Sleep is good most nights.  She notes some mild depression and decreased libido.     She takes Vit D supplements.   Her last level was 54 (normal).  She has cognitive issues including reduced word finding, short-term memory, focus/attention.  Additionally she has noted r reduced ability to parallel process.   MS history: She was diagnosed with MS in 2006.  She was mowing the lawn on a hot day when she felt weak and dizzy.   The next morning she woke up numb form the  waist down.   When she did not improve, she saw her doctor.   She had MRIs and a lumbar puncture.   The MRI showed lesions consistent with MS,   She had oligoclonal bands in her CSF.   Initially, she was placed on Betaseron but had side effects causing body aches.  Then she was placed on Copaxone and switched to Tecfidera after having an exacerbation.  She tolerated Tecfidera well but her lymphocyte count dropped and she was switched to Aubagio.  More recently she switched from Aubagio to Ocrevus to be on a more effective medication.   She tolerates it well and her last infusion was Nov 30, 2017.   Her first infusion was November 2018.      Imaging studies: January 2018 MRI of the brain and cervical spine:   The brain showed several white matter foci which could be consistent with MS.  The cervical spinal cord showed at least 5 foci c/w demyelination.     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    06/25/2018 MRI of the brain and cervical spine: The brain is unchanged.  The cervical spine is unchanged.  There is a mild Chiari malformation with 6-7 mm of left cerebellar ectopia.   The spinal cord is of normal caliber.    There are T2 hyperintense  foci anteromedially adjacent to C2, posterior to the left adjacent to C2, laterally to the right adjacent to C2-C3, lateral to the left adjacent to C4 and posterolateral to the left adjacent to C4-C5.  Additionally, there are degenerative changes at C4-C5 through C6-C7.  03/21/2019 MRI: The MRI of the brain showed similar pattern of T2/FLAIR hyperintense foci in the hemispheres.  There is 8 mm cerebellar ectopia consistent with a Chiari I malformation and increased compared to the previous MRI.  (This was performed after neurologic symptoms that followed an epidural steroid injection)  Other: She has scoliosis causing some back pain and has been told it has worsened.   NCV/EMG 02/25/2019 shows mild left ulnar neuropathy, not severe enough to explain  atrophy in the left hand.  Reduced recruitment in the C7 and C8 myotomes most consistent with a chronic demyelinating plaque involving the anterior horn cells.    REVIEW OF SYSTEMS: Constitutional: No fevers, chills, sweats, or change in appetite.  She has fatigue. Eyes: No visual changes, double vision, eye pain.  There is light sensitivity. Ear, nose and throat: No hearing loss, ear pain, nasal congestion, sore throat.  She has tinnitus. Cardiovascular: No chest pain, palpitations Respiratory: No shortness of breath at rest or with exertion.   No wheezes GastrointestinaI: No nausea, vomiting, diarrhea, abdominal pain, fecal incontinence Genitourinary: No dysuria, urinary retention or frequency.  No nocturia. Musculoskeletal: No neck pain, back pain.   She has scoliosis.   Integumentary: No rash, pruritus currently though she does have psoriasis and some skin moles Neurological: as above Psychiatric: No depression at this time.  No anxiety Endocrine: No palpitations, diaphoresis, change in appetite, change in weigh or increased thirst Hematologic/Lymphatic: No anemia, purpura, petechiae. Allergic/Immunologic: No itchy/runny eyes, nasal congestion, recent allergic reactions, rashes  ALLERGIES: Allergies  Allergen Reactions  . Colophony [Pinus Strobus]   . Formaldehyde   . Propylene Glycol   . Quaternium-15     HOME MEDICATIONS:  Current Outpatient Medications:  .  AMBULATORY NON FORMULARY MEDICATION, To Restore Prosthetics and Orthotics Attn Douglas Leclair Right knee brace for genu recurvatum to assist stability and ambulation. Left ankle-foot orthosis for foot drop to assess stability and ambulation. Duration of need = lifelong., Disp: 1 each, Rfl: 0 .  baclofen (LIORESAL) 20 MG tablet, Take 1 tablet (20 mg total) by mouth 4 (four) times daily., Disp: 360 tablet, Rfl: 1 .  Biotin 1000 MCG tablet, Take 1,000 mcg by mouth daily. , Disp: , Rfl:  .  CALCIUM-VITAMIN D PO, Take by  mouth., Disp: , Rfl:  .  Cholecalciferol (D3-1000) 25 MCG (1000 UT) capsule, Take 1,000 Units by mouth daily., Disp: , Rfl:  .  Clobetasol Propionate (CLOBEX SPRAY) 0.05 % external spray, Clobex 0.05 % topical spray, Disp: , Rfl:  .  CLODERM 0.1 % cream, APPLY TOPICALLY TO THE AFFECTED AREA EVERY DAY, Disp: , Rfl:  .  fluocinonide (LIDEX) 0.05 % external solution, Apply 1 application topically as needed., Disp: , Rfl:  .  modafinil (PROVIGIL) 200 MG tablet, Take 1 tablet (200 mg total) by mouth daily., Disp: 90 tablet, Rfl: 1 .  Multiple Vitamins-Minerals (MULTIVITAL) tablet, Take 1 tablet by mouth daily., Disp: , Rfl:  .  naproxen (NAPROSYN) 500 MG tablet, Take 1 tablet (500 mg total) by mouth 2 (two) times daily as needed., Disp: 180 tablet, Rfl: 0 .  nitrofurantoin, macrocrystal-monohydrate, (MACROBID) 100 MG capsule, Take 1 capsule (100 mg total) by mouth 2 (two) times daily., Disp: 10  capsule, Rfl: 0 .  ocrelizumab 600 mg in sodium chloride 0.9 % 500 mL, Inject 600 mg into the vein every 6 (six) months., Disp: , Rfl:  .  Omega-3 Fatty Acids (FISH OIL) 1000 MG CAPS, Take by mouth., Disp: , Rfl:  .  omeprazole (PRILOSEC) 40 MG capsule, Take 40 mg by mouth as needed. , Disp: , Rfl:  .  Probiotic Product (ALIGN PO), Take by mouth., Disp: , Rfl:  .  solifenacin (VESICARE) 5 MG tablet, Take 1 tablet (5 mg total) by mouth daily., Disp: 90 tablet, Rfl: 1 .  tamsulosin (FLOMAX) 0.4 MG CAPS capsule, Take 1 capsule (0.4 mg total) by mouth daily., Disp: 90 capsule, Rfl: 1 .  tiZANidine (ZANAFLEX) 4 MG tablet, Take 1 to 2 capsules nightly, Disp: 180 tablet, Rfl: 1 .  buPROPion (WELLBUTRIN XL) 300 MG 24 hr tablet, Take 1 tablet (300 mg total) by mouth daily., Disp: 90 tablet, Rfl: 3  PAST MEDICAL HISTORY: Past Medical History:  Diagnosis Date  . Multiple sclerosis (HCC)   . Psoriasis   . Scoliosis     PAST SURGICAL HISTORY: Past Surgical History:  Procedure Laterality Date  . AUGMENTATION  MAMMAPLASTY    . bone navicular  07/05/2006  . FINGER GANGLION CYST EXCISION    . FINGER SURGERY  06/28/2015   capsular release due to injury  . FOOT SURGERY     right and left foot  . FOOT SURGERY    . HEMORRHOIDECTOMY WITH HEMORRHOID BANDING  04/19/2017  . LUMBAR EPIDURAL INJECTION  04/27/2017  . lymph node removal  1976  . REFRACTIVE SURGERY  06/02/1994  . 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 Marital status: Single    Spouse name: Not on file  . Number of children: 0  . Years of education: Masters  . Highest education level: Not on file  Occupational History  . Occupation: unemployed  Tobacco Use  . Smoking status: Former Smoker    Packs/day: 1.00  . Smokeless tobacco: Never Used  Vaping Use  . Vaping Use: Never used  Substance and Sexual Activity  . Alcohol use: Yes    Comment: 1 per week  . Drug use: Never  . Sexual activity: Yes    Partners: Male  Other Topics Concern  . Not on file  Social History Narrative   Lives w/ significant other, Sharen Hint   Caffeine use: 2 cups per day   Left handed but transitioning to be right handed d/t being weak in left hand   Social Determinants of Health   Financial Resource Strain:   . Difficulty of Paying Living Expenses:   Food Insecurity:   . Worried About Programme researcher, broadcasting/film/video in the Last Year:   . Barista in the Last Year:   Transportation Needs:   . Freight forwarder (Medical):   Marland Kitchen Lack of Transportation (Non-Medical):   Physical Activity:   . Days of Exercise per Week:   . Minutes of Exercise per Session:   Stress:   . Feeling of Stress :   Social Connections:   . Frequency of Communication with Friends and Family:   . Frequency of Social Gatherings with Friends and Family:   . Attends Religious Services:    .  Active Member of Clubs or Organizations:   . Attends Archivist Meetings:   Marland Kitchen Marital Status:   Intimate Partner Violence:   . Fear of Current or Ex-Partner:   . Emotionally Abused:   Marland Kitchen Physically Abused:   . Sexually Abused:      PHYSICAL EXAM  Vitals:   01/09/20 1023  BP: 113/71  Pulse: 71  Weight: 217 lb (98.4 kg)  Height: 5\' 7"  (1.702 m)    Body mass index is 33.99 kg/m.   General: The patient is well-developed and well-nourished and in no acute distress   Neck: The neck is supple, no carotid bruits are noted.  She has mild tenderness midline at the skull base   Skin: Extremities are without rash or edema.  Neurologic Exam  Mental status: The patient is alert and oriented x 3 at the time of the examination. The patient has apparent normal recent and remote memory, with an apparently normal attention span and concentration ability.   Speech is normal.  Cranial nerves: Extraocular movements are full.   There is good facial sensation to soft touch bilaterally.Facial strength is normal.  Trapezius and sternocleidomastoid strength is normal. No dysarthria is noted. No obvious hearing deficits are noted.  Motor:  There is some increased muscle tone in the left leg.. Strength is  5 / 5 on the right.  Strength is 4/5 in the left arm except 3/5 in the intrinsic hand muscles.  She has atrophy of the intrinsic hand muscles..  Strength is 2/5 in the left iliopsoas, 4/5 in the left quadriceps, 4-/5 in the left hamstrings, not evaluated in the left foot and ankle due to brace .was 2-/5 last visit.  Sensory: She has intact sensation to touch and vibration in the limbs  Coordination: Cerebellar testing reveals reduced left finger-nose-finger and can't do left heel-to-shin .  Gait and station: Station is normal.  She is wearing a left AFO brace.  Very poor gait due to the left weakness.  She is only able to walk 75 feet without the use of her cane..  She is unable to do a  tandem walk.  The Romberg is borderline..   Reflexes: Deep tendon reflexes are symmetric and increased on the left relative to the right.   Plantar responses are flexor on the right and extensor on the left.  ______________________________________________________  ASSESSMENT AND PLAN  1. Multiple sclerosis ([place])   2. 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 Abnormal gait   4. Other fatigue   5. Left hand weakness     1.   She will continue Ocrevus for her relapsing remitting MS.   Her next infusion is in May.    2.   Due to a combination of physical (weakness and reduced coordination of dominant left side) and cognitive issues (fatigue, reduced ability to multitask, memory disturbance) related to her MS, she is permanently disabled.  Her EDSS is 6.0 even wearing left AFO brace 3.   Wellbutrin XL 300 mg.   4.   She will return to see me in 6 months but sooner if there are new or worsening neurologic symptoms.  45-minute office visit with the majority of the time spent face-to-face for history and physical, discussion/counseling and decision-making.  Additional time with record review and documentation.    Drevin Ortner A. Felecia Shelling, MD, PhD, FAAN Certified in Neurology, Clinical Neurophysiology, Sleep Medicine, Pain Medicine and Neuroimaging Director, [place] [facility]  [address], [place], [place] [address] [phone]

## 2020-01-11 NOTE — Progress Notes (Signed)
Subjective: 59 year old female presents the office today requesting a steroid injection to the right foot.  She is previously seen Dr. Charlsie Merles but she was to further discuss what is going on with her feet particular the right side.  She states the left has been doing well and she has an AFO brace for this.  She had what appears to be a mezzo brace on the right side.  She tried other braces which has not been doing as well.  She wants to see if there is any other options besides bracing and injections that she can consider. Denies any systemic complaints such as fevers, chills, nausea, vomiting. No acute changes since last appointment, and no other complaints at this time.   Objective: AAO x3, NAD DP/PT pulses palpable bilaterally, CRT less than 3 seconds Significant flatfoot is present.  On the right side there is tenderness on the lateral aspect the foot on the sinus tarsi and there is localized edema to this area any erythema or warmth.  Flexor, extensor tendons appear to be intact.  Mild discomfort on the course of the posterior tibial, flexor tendons posterior to the medial malleolus.  There is no other areas of tenderness identified.  Dropfoot present on the left side.  No pain the left foot.  No open lesions or pre-ulcerative lesions.  No pain with calf compression, swelling, warmth, erythema  Assessment: Right subtalar joint capsulitis, flatfoot deformity  Plan: -All treatment options discussed with the patient including all alternatives, risks, complications.  -We had a long discussion regards to etiology as well as natural progression flatfoot.  Ultimately I think for now bracing.  Injection is currently the most beneficial.  Discussed the new brace.  Also can consider reconstructive surgery in the future if she desires. -Steroid injection performed to the right side to the sinus tarsi.  Skin was cleaned with Betadine, alcohol mixture 1 cc, 10, 0.5 cc of Marcaine plain, 0.5 cc of lidocaine  plain was infiltrated into the area of maximal tenderness without complications.  Postinjection care was discussed. -Prescription for new brace was given on the right side. -Patient encouraged to call the office with any questions, concerns, change in symptoms.   Vivi Barrack DPM

## 2020-01-13 ENCOUNTER — Ambulatory Visit: Payer: BLUE CROSS/BLUE SHIELD | Admitting: Neurology

## 2020-01-29 DIAGNOSIS — M79676 Pain in unspecified toe(s): Secondary | ICD-10-CM

## 2020-01-30 LAB — LIPID PANEL
Chol/HDL Ratio: 4.1 ratio (ref 0.0–4.4)
Cholesterol, Total: 219 mg/dL — ABNORMAL HIGH (ref 100–199)
HDL: 53 mg/dL (ref >39)
LDL Chol Calc (NIH): 132 mg/dL — ABNORMAL HIGH (ref 0–99)
Triglycerides: 190 mg/dL — ABNORMAL HIGH (ref 0–149)
VLDL Cholesterol Cal: 34 mg/dL (ref 5–40)

## 2020-01-30 LAB — SPECIMEN STATUS REPORT

## 2020-02-05 ENCOUNTER — Ambulatory Visit: Payer: 59 | Admitting: Physician Assistant

## 2020-02-12 ENCOUNTER — Encounter: Payer: Self-pay | Admitting: Osteopathic Medicine

## 2020-02-12 ENCOUNTER — Ambulatory Visit (INDEPENDENT_AMBULATORY_CARE_PROVIDER_SITE_OTHER): Payer: 59 | Admitting: Osteopathic Medicine

## 2020-02-12 ENCOUNTER — Other Ambulatory Visit: Payer: Self-pay | Admitting: Osteopathic Medicine

## 2020-02-12 VITALS — BP 114/79 | HR 88 | Temp 98.0°F | Ht 67.0 in | Wt 211.0 lb

## 2020-02-12 DIAGNOSIS — Z0000 Encounter for general adult medical examination without abnormal findings: Secondary | ICD-10-CM | POA: Diagnosis not present

## 2020-02-12 DIAGNOSIS — L409 Psoriasis, unspecified: Secondary | ICD-10-CM

## 2020-02-12 DIAGNOSIS — G35 Multiple sclerosis: Secondary | ICD-10-CM

## 2020-02-12 DIAGNOSIS — R748 Abnormal levels of other serum enzymes: Secondary | ICD-10-CM

## 2020-02-12 LAB — COMPLETE METABOLIC PANEL WITH GFR
AG Ratio: 1.6 (calc) (ref 1.0–2.5)
ALT: 57 U/L — ABNORMAL HIGH (ref 6–29)
AST: 31 U/L (ref 10–35)
Albumin: 4.1 g/dL (ref 3.6–5.1)
Alkaline phosphatase (APISO): 85 U/L (ref 37–153)
BUN: 17 mg/dL (ref 7–25)
CO2: 26 mmol/L (ref 20–32)
Calcium: 9.5 mg/dL (ref 8.6–10.4)
Chloride: 104 mmol/L (ref 98–110)
Creat: 0.83 mg/dL (ref 0.50–1.05)
GFR, Est African American: 91 mL/min/{1.73_m2} (ref >=60)
GFR, Est Non African American: 79 mL/min/{1.73_m2} (ref >=60)
Globulin: 2.6 g/dL (calc) (ref 1.9–3.7)
Glucose, Bld: 98 mg/dL (ref 65–139)
Potassium: 4.3 mmol/L (ref 3.5–5.3)
Sodium: 141 mmol/L (ref 135–146)
Total Bilirubin: 0.4 mg/dL (ref 0.2–1.2)
Total Protein: 6.7 g/dL (ref 6.1–8.1)

## 2020-02-12 LAB — TSH: TSH: 1.81 mIU/L (ref 0.40–4.50)

## 2020-02-12 NOTE — Progress Notes (Signed)
Meredith Hale is a 59 y.o. female who presents to  [facility]  today, 02/12/20, seeking care for the following:  . Annual Physical  . Concern about lipids - reviewed labs in detail and ASCVD risk. Concern for elevated liver enzymes.      ASSESSMENT & PLAN with other pertinent findings:  The primary encounter diagnosis was Annual physical exam. Diagnoses of Multiple sclerosis (HCC), Psoriasis, and Elevated liver enzymes were also pertinent to this visit.    Patient Instructions  General Preventive Care  Most recent routine screening labs: reviewed from 12/2019.   Blood pressure goal 130/80 or less.   Tobacco: don't! Please let me know if you need help quitting!  Alcohol: responsible moderation is ok for most adults - if you have concerns about your alcohol intake, please talk to me!   Exercise: as tolerated to reduce risk of cardiovascular disease and diabetes. Strength training will also prevent osteoporosis.   Mental health: if need for mental health care (medicines, counseling, other), or concerns about moods, please let me know!   Sexual / Reproductive health: if need for STD testing, or if concerns with libido/pain problems, please let me know!   Advanced Directive: Living Will and/or Healthcare Power of Attorney recommended for all adults, regardless of age or health.  Vaccines  Flu vaccine: for almost everyone, every fall.   Shingles vaccine: after age 62.   Pneumonia vaccines: done, booster at age 81  Tetanus booster: due 2026.    COVID vaccine: THANKS for getting your vaccine! :) Cancer screenings   Colon cancer screening: age 17-75. Colonoscopy due 03/2022  Breast cancer screening: mammogram annually age 51-75, due 12/2020   Cervical cancer screening: Pap every 1 to 5 years depending on age and other risk factors. Can usually stop at age 83 or w/ hysterectomy.   Lung cancer screening: CT chest  every year for those aged 27 to 80 years who have a 20 pack-year smoking history and currently smoke or have quit within the past 15 years  Infection screenings  . HIV: recommended screening at least once age 20-65 . Gonorrhea/Chlamydia: screening as needed . Hepatitis C: recommended once for everyone age 27-75 . TB: certain at-risk patients Other . Bone Density Test: recommended for women at age 47, or 50+ for smokers   Orders Placed This Encounter  Procedures  . COMPLETE METABOLIC PANEL WITH GFR  . TSH    No orders of the defined types were placed in this encounter.   Constitutional:  . VSS, see nurse notes . General Appearance: alert, well-developed, well-nourished, NAD Neck: . No masses, trachea midline . No thyroid enlargement/tenderness/mass appreciated Respiratory: . Normal respiratory effort . Breath sounds normal, no wheeze/rhonchi/rales Cardiovascular: . S1/S2 normal, no murmur/rub/gallop auscultated . No lower extremity edema Neurological: . No cranial nerve deficit on limited exam Psychiatric: . Normal judgment/insight . Normal mood and affect    Follow-up instructions: Return in about 1 year (around 02/11/2021) for [employer], SEE [place] SOONER IF NEEDED .OK to see me for PAP ONLY 04/2020                                         BP 114/79 (BP Location: Left Arm, Patient Position: Sitting)   Pulse 88   Temp 98 F (36.7 C)   Ht 5\' 7"  (1.702 m)  Wt (!) 211 lb (95.7 kg)   SpO2 97%   BMI 33.05 kg/m   Current Meds  Medication Sig  . AMBULATORY NON FORMULARY MEDICATION To Restore Prosthetics and Orthotics Attn Douglas Leclair Right knee brace for genu recurvatum to assist stability and ambulation. Left ankle-foot orthosis for foot drop to assess stability and ambulation. Duration of need = lifelong.  . baclofen (LIORESAL) 20 MG tablet Take 1 tablet (20 mg total) by mouth 4 (four) times daily.  . Biotin 1000 MCG  tablet Take 1,000 mcg by mouth daily.   Marland Kitchen buPROPion (WELLBUTRIN XL) 300 MG 24 hr tablet Take 1 tablet (300 mg total) by mouth daily.  Marland Kitchen CALCIUM-VITAMIN D PO Take by mouth.  . Cholecalciferol (D3-1000) 25 MCG (1000 UT) capsule Take 1,000 Units by mouth daily.  . Clobetasol Propionate (CLOBEX SPRAY) 0.05 % external spray Clobex 0.05 % topical spray  . CLODERM 0.1 % cream APPLY TOPICALLY TO THE AFFECTED AREA EVERY DAY  . fluocinonide (LIDEX) 0.05 % external solution Apply 1 application topically as needed.  . modafinil (PROVIGIL) 200 MG tablet Take 1 tablet (200 mg total) by mouth daily.  . Multiple Vitamins-Minerals (MULTIVITAL) tablet Take 1 tablet by mouth daily.  . naproxen (NAPROSYN) 500 MG tablet Take 1 tablet (500 mg total) by mouth 2 (two) times daily as needed.  Marland Kitchen ocrelizumab 600 mg in sodium chloride 0.9 % 500 mL Inject 600 mg into the vein every 6 (six) months.  . Omega-3 Fatty Acids (FISH OIL) 1000 MG CAPS Take by mouth.  Marland Kitchen omeprazole (PRILOSEC) 40 MG capsule Take 40 mg by mouth as needed.   . Probiotic Product (ALIGN PO) Take by mouth.  . solifenacin (VESICARE) 5 MG tablet Take 1 tablet (5 mg total) by mouth daily.  . tamsulosin (FLOMAX) 0.4 MG CAPS capsule Take 1 capsule (0.4 mg total) by mouth daily.  Marland Kitchen tiZANidine (ZANAFLEX) 4 MG tablet Take 1 to 2 capsules nightly  . [DISCONTINUED] nitrofurantoin, macrocrystal-monohydrate, (MACROBID) 100 MG capsule Take 1 capsule (100 mg total) by mouth 2 (two) times daily.    No results found for this or any previous visit (from the past 72 hour(s)).  No results found.     All questions at time of visit were answered - patient instructed to contact office with any additional concerns or updates.  ER/RTC precautions were reviewed with the patient as applicable.   Please note: voice recognition software was used to produce this document, and typos may escape review. Please contact Dr. Lyn Hollingshead for any needed clarifications.

## 2020-02-12 NOTE — Patient Instructions (Addendum)
General Preventive Care  Most recent routine screening labs: reviewed from 12/2019.   Blood pressure goal 130/80 or less.   Tobacco: don't! Please let me know if you need help quitting!  Alcohol: responsible moderation is ok for most adults - if you have concerns about your alcohol intake, please talk to me!   Exercise: as tolerated to reduce risk of cardiovascular disease and diabetes. Strength training will also prevent osteoporosis.   Mental health: if need for mental health care (medicines, counseling, other), or concerns about moods, please let me know!   Sexual / Reproductive health: if need for STD testing, or if concerns with libido/pain problems, please let me know!   Advanced Directive: Living Will and/or Healthcare Power of Attorney recommended for all adults, regardless of age or health.  Vaccines  Flu vaccine: for almost everyone, every fall.   Shingles vaccine: after age 18.   Pneumonia vaccines: done, booster at age 21  Tetanus booster: due 2026.    COVID vaccine: THANKS for getting your vaccine! :) Cancer screenings   Colon cancer screening: age 48-75. Colonoscopy due 03/2022  Breast cancer screening: mammogram annually age 61-75, due 12/2020   Cervical cancer screening: Pap every 1 to 5 years depending on age and other risk factors. Can usually stop at age 95 or w/ hysterectomy.   Lung cancer screening: CT chest every year for those aged 42 to 80 years who have a 20 pack-year smoking history and currently smoke or have quit within the past 15 years  Infection screenings  . HIV: recommended screening at least once age 22-65 . Gonorrhea/Chlamydia: screening as needed . Hepatitis C: recommended once for everyone age 42-75 . TB: certain at-risk patients Other . Bone Density Test: recommended for women at age 31, or 50+ for smokers

## 2020-03-17 ENCOUNTER — Telehealth: Payer: Self-pay | Admitting: Neurology

## 2020-03-17 DIAGNOSIS — R5383 Other fatigue: Secondary | ICD-10-CM

## 2020-03-17 DIAGNOSIS — G8114 Spastic hemiplegia affecting left nondominant side: Secondary | ICD-10-CM

## 2020-03-17 DIAGNOSIS — R269 Unspecified abnormalities of gait and mobility: Secondary | ICD-10-CM

## 2020-03-17 DIAGNOSIS — R4189 Other symptoms and signs involving cognitive functions and awareness: Secondary | ICD-10-CM

## 2020-03-17 DIAGNOSIS — G35 Multiple sclerosis: Secondary | ICD-10-CM

## 2020-03-17 DIAGNOSIS — R29898 Other symptoms and signs involving the musculoskeletal system: Secondary | ICD-10-CM

## 2020-03-17 NOTE — Telephone Encounter (Signed)
Called and spoke with pt. She would like testing for both her physical and cognitive disabilities. She is aware we will place a referral for PT/functionall capacity exam and for neurocognitive testing. She will call back if she does not hear about getting them scheduled in the next couple weeks.

## 2020-03-17 NOTE — Telephone Encounter (Signed)
Pt called wanting to discuss with RN or provider about getting "formal testing" on her MS. Please advise.

## 2020-03-17 NOTE — Telephone Encounter (Signed)
Spoke with Dr. Epimenio Foot. He recommend PT referral if she has more physical disabilities. If more cognitive, we can refer for neurocognitive testing.

## 2020-03-17 NOTE — Telephone Encounter (Signed)
LVM returning pt call.Asked for pt to call back

## 2020-03-17 NOTE — Telephone Encounter (Signed)
Took call from phone staff and spoke with pt. She is wanting further evaluation of her disabilities secondary to MS. She does not feel there is enough documentation on what she can and cannot do. I recommended it may be best for her to be referred to PT for a functional capacity test. I will speak with Dr. Epimenio Foot about this and call her back to let her know for sure. She verbalized understanding.

## 2020-03-18 NOTE — Telephone Encounter (Signed)
Called pt back. Advised we placed referral for PT/neuro-rehab next door to have her complete functional capacity exam. Their phone: [phone]. She will call to try and get appt scheduled.

## 2020-03-18 NOTE — Telephone Encounter (Signed)
Pt is asking for a call from Emma,RN re: orders so she is tested for Dexterity and Fine Motor Skills, please call pt to discuss.

## 2020-03-23 ENCOUNTER — Other Ambulatory Visit: Payer: Self-pay | Admitting: *Deleted

## 2020-03-23 MED ORDER — SOLIFENACIN SUCCINATE 5 MG PO TABS: 5.0000 mg | ORAL_TABLET | Freq: Every day | ORAL | 1 refills | Status: DC

## 2020-03-24 ENCOUNTER — Other Ambulatory Visit: Payer: Self-pay

## 2020-03-24 ENCOUNTER — Ambulatory Visit: Payer: 59 | Attending: Neurology

## 2020-03-24 DIAGNOSIS — G35 Multiple sclerosis: Secondary | ICD-10-CM | POA: Insufficient documentation

## 2020-03-24 NOTE — Therapy (Signed)
[facility] [address] [place], [place], [address] Phone: [phone]   Fax:  [phone]  Physical Therapy Evaluation/FCE  Patient Details  Name: Meredith Hale MRN: [id number] Date of Birth: 10/19/1960 Referring Provider (PT): Despina Arias, MD   Encounter Date: 03/24/2020   PT End of Session - 03/24/20 1315    Visit Number 1    Number of Visits 1    Authorization Type [employer]    PT Start Time 0105    PT Stop Time 0350    PT Time Calculation (min) 165 min    Behavior During Therapy [facility] for tasks assessed/performed           Past Medical History:  Diagnosis Date  . Multiple sclerosis (HCC)   . Psoriasis   . Scoliosis     Past Surgical History:  Procedure Laterality Date  . AUGMENTATION MAMMAPLASTY    . bone navicular  07/05/2006  . FINGER GANGLION CYST EXCISION    . FINGER SURGERY  06/28/2015   capsular release due to injury  . FOOT SURGERY     right and left foot  . FOOT SURGERY    . HEMORRHOIDECTOMY WITH HEMORRHOID BANDING  04/19/2017  . LUMBAR EPIDURAL INJECTION  04/27/2017  . lymph node removal  1976  . REFRACTIVE SURGERY  06/02/1994  . 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    Subjective Assessment - 03/24/20 1311    Subjective She reports she is here for baseline of capability and disability.              [facility] PT Assessment - 03/24/20 0001      Assessment   Medical Diagnosis Meredith    Referring Provider (PT) Despina Arias, MD    Onset Date/Surgical Date --   2006   Next MD Visit !08/2019    Prior Therapy PT/OT last in 2018      Precautions   Precautions Fall      Restrictions   Weight Bearing Restrictions No      Balance Screen   Has the patient fallen in the past 6 months Yes    How many times? 2   foot caught and LOB   Has the patient had a decrease in activity level because of a fear  of falling?  Yes    Is the patient reluctant to leave their home because of a fear of falling?  No                      Objective measurements completed on examination: See above findings.               PT Education - 03/24/20 1622    Education Details Possible soreness for 2-4 days post and to rest but stay active to ease off soreness    Person(s) Educated Patient    Methods Explanation    Comprehension Verbalized understanding                       Plan - 03/24/20 1624    Clinical Impression Statement Meredith Hale completed the FCE testing today with a rating of < sedentary.   Fatigue , weakness LT > RT Extremities and need for SPC to balance or +t min assist to prevent falls all limitied her functional ability. UE functional assessment was  not specifically done and if this is needed  she should be referred to Occupational Therapy in Neuro PT for a full assessment. Her gross use of her Lt hand was insufficient to lift or hold for any time with functional lifting and carrying tasks.    PT Next Visit Plan No folllow up with PT. FCE report will be faxed to Dr Epimenio Foot.    Recommended Other Services Neuro OT for complete assessment of hand function if necessary    Consulted and Agree with Plan of Care Patient           Patient will benefit from skilled therapeutic intervention in order to improve the following deficits and impairments:     Visit Diagnosis: Multiple sclerosis (HCC)     Problem List Patient Active Problem List   Diagnosis Date Noted  . Psoriasis 11/21/2019  . Abnormal gait 05/19/2019  . 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 Multiple sclerosis (HCC) 01/21/2019  . Left spastic hemiplegia (HCC) 01/21/2019  . Other fatigue 01/21/2019  . Urinary hesitancy 01/21/2019  . Vitamin D deficiency 01/21/2019  . Normal gynecologic examination 08/16/2018    Caprice Red  PT 03/24/2020, 4:29  PM  [facility] [address] [place], [place], [address] Phone: [phone]   Fax:  [phone]  Name: Meredith Hale MRN: [id number] Date of Birth: 1960/11/16 PHYSICAL THERAPY DISCHARGE SUMMARY  Visits from Start of Care: 1  Current functional level related to goals / functional outcomes:   See FCE report Remaining deficits: See FCE report   Education / Equipment: Reviewed report with Meredith Name

## 2020-03-25 ENCOUNTER — Other Ambulatory Visit: Payer: Self-pay | Admitting: *Deleted

## 2020-03-25 MED ORDER — MODAFINIL 200 MG PO TABS
200.0000 mg | ORAL_TABLET | Freq: Every day | ORAL | 1 refills | Status: DC
Start: 2020-03-25 — End: 2020-10-18

## 2020-03-29 ENCOUNTER — Telehealth: Payer: Self-pay | Admitting: Physical Therapy

## 2020-03-30 NOTE — Telephone Encounter (Signed)
Spoke with Meredith Hale about the FCE report. Results were reviewed. She a questions about hand function assessment and I told her I felt it better to have the OT's assess this as I do not do this on a regular basis. The report does state about her gross hand strength and function. She will wait on MD review and discuss with him.

## 2020-04-01 ENCOUNTER — Other Ambulatory Visit: Payer: Self-pay | Admitting: *Deleted

## 2020-04-01 ENCOUNTER — Other Ambulatory Visit: Payer: Self-pay | Admitting: Neurology

## 2020-04-01 DIAGNOSIS — G35 Multiple sclerosis: Secondary | ICD-10-CM

## 2020-04-01 DIAGNOSIS — G8114 Spastic hemiplegia affecting left nondominant side: Secondary | ICD-10-CM

## 2020-04-01 DIAGNOSIS — R29898 Other symptoms and signs involving the musculoskeletal system: Secondary | ICD-10-CM

## 2020-04-05 ENCOUNTER — Encounter: Payer: Self-pay | Admitting: Psychology

## 2020-04-05 ENCOUNTER — Other Ambulatory Visit: Payer: Self-pay

## 2020-04-05 ENCOUNTER — Encounter: Payer: 59 | Attending: Psychology | Admitting: Psychology

## 2020-04-05 DIAGNOSIS — R419 Unspecified symptoms and signs involving cognitive functions and awareness: Secondary | ICD-10-CM | POA: Insufficient documentation

## 2020-04-05 DIAGNOSIS — G35 Multiple sclerosis: Secondary | ICD-10-CM | POA: Diagnosis not present

## 2020-04-05 NOTE — Progress Notes (Signed)
NEUROBEHAVIORAL STATUS EXAM   Name: Meredith Hale Date of Birth: [birth year] Date of Interview: 04/05/2020   Reason for Referral: Meredith Hale is a 59 y.o. female who is referred for neuropsychological evaluation by Dr. Epimenio Foot of [facility] due to cognitive concerns in the context of relapsing remitting multiple sclerosis (diagnosed in 2006). This patient is unaccompanied in the office today.   Sources of Information: The following information was gathered from a clinical interview with Ms. Meredith Hale and from a review of available medical records. The patient expressed understanding of the purpose of the evaluation and consented to all procedures.   History of Presenting Problem: Meredith Hale is a 59 year old woman with relapsing remitting MS diagnosed in 2006 though she had a Lhermitte sign in 1999.    She has recently been followed by Dr. Epimenio Foot of [facility] who reports the following regarding her MS history:  "She was diagnosed with MS in 2006.  She was mowing the lawn on a hot day when she felt weak and dizzy.   The next morning she woke up numb form the waist down.   When she did not improve, she saw her doctor.   She had MRIs and a lumbar puncture.   The MRI showed lesions consistent with MS,   She had oligoclonal bands in her CSF.   Initially, she was placed on Betaseron but had side effects causing body aches.  Then she was placed on Copaxone and switched to Tecfidera after having an exacerbation.  She tolerated Tecfidera well but her lymphocyte count dropped and she was switched to Aubagio.  More recently she switched from Aubagio to Ocrevus to be on a more effective medication.   She tolerates it well and her last infusion was Nov 30, 2017.   Her first infusion was November 2018."  As of 01/09/20, Dr. Epimenio Foot reported that she was: "on Ocrevus and she tolerates it well but the LFTs were increased last week.  We discussed repeating the LFTs  sometime the next month.  She has not had any exacerbations while on Ocrevus.  However, she has had some progression of symptoms with worsening gait. She uses a cane and has reduced gait with some falls.   A crutch helps better than a standard cane which she uses more as easier to take with her.   She has lost fine motor skill dexterity in her dominant left hand.   Writing and preparing skills is more difficult. Left leg also affected more. Due to the spasticity on the left side she takes baclofen and tizanidine with benefit.  She also has urinary urgency.  Review of the last brain MRI shows she has several T2 hyperintense foci in her spinal cord. She has fatigue, worse some days than others..   Sleep is good most nights.  She notes some mild depression and decreased libido. She takes Vit D supplements.  Her last level was 54 (normal).  She has cognitive issues including reduced word finding, short-term memory, focus/attention.  Additionally she has noted reduced ability to parallel process."  Imaging studies: January 2018 MRI of the brain and cervical spine:   The brain showed several white matter foci which could be consistent with MS.  The cervical spinal cord showed at least 5 foci c/w demyelination.     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    06/25/2018 MRI of the brain and cervical spine: The brain is unchanged.  The cervical spine is unchanged.  There is a mild Chiari malformation with 6-7 mm of left cerebellar ectopia. The spinal cord is of normal caliber. There are T2 hyperintense foci anteromedially adjacent to C2, posterior to the left adjacent to C2, laterally to the right adjacent to C2-C3, lateral to the left adjacent to C4 and posterolateral to the left adjacent to C4-C5.  Additionally, there are degenerative changes at C4-C5 through C6-C7.  03/21/2019 MRI: The MRI of the brain showed similar pattern of T2/FLAIR hyperintense foci in the hemispheres.  There is 8 mm  cerebellar ectopia consistent with a Chiari I malformation and increased compared to the previous MRI.  (This was performed after neurologic symptoms that followed an epidural steroid injection)  Other: She has scoliosis causing some back pain and has been told it has worsened. NCV/EMG 02/25/2019 shows mild left ulnar neuropathy, not severe enough to explain atrophy in the left hand.  Reduced recruitment in the C7 and C8 myotomes most consistent with a chronic demyelinating plaque involving the anterior horn cells.  Upon direct questioning, the patient reported:   Forgetting recent conversations/events: Denied  Repeating statements/questions: Denied  Misplacing/losing items: Denied  Forgetting appointments or other obligations: Denied  Forgetting to take medications: Denied   Difficulty concentrating: Mild difficulty Starting but not finishing tasks: Denied  Distracted easily: Mild difficulty Processing information more slowly: Yes   Word-finding difficulty: On occasion Word substitutions: Rare Writing difficulty: Significant trouble with motor aspect  Spelling difficulty: Denied  Comprehension difficulty: Denied  Getting lost when driving: on occasion  Making wrong turns when driving: on occasion  Uncertain about directions when driving or passenger: Yes   Family neuro hx: Unknown.  Any family hx dementia? Unknown.   Current Functioning: Work: Unemployed   Complex ADLs Driving: Currently able to drive during the day but with some difficulty. Avoids driving at night.  Medication management: Able to manage independently  Management of finances: Able to manage independently  Appointments: Able to manage independently without difficulty Cooking: Able to perform independently but takes 2-3 x as long   Medical/Physical complaints:  Any hx of stroke/TIA, MI, LOC/TBI, Sz? Denied  Hx falls? Yes Balance, probs walking? Yes, significant left sided weakness. Sleep: Insomnia? OSA?  CPAP? REM sleep beh sx? Denied  Visual illusions/hallucinations? Denied. Appetite/Nutrition/Weight changes? Denied.   Current mood: Mildly depressed  Behavioral disturbance/Personality change? Denied Suicidal Ideation/Intention: Denied   Psychiatric History: History of depression, anxiety, other MH disorder: Recent history of depression since moving to [place] and experiencing problems finding a job.  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 Multiple sclerosis (HCC)   . Psoriasis   . Scoliosis    Current Medications:  Outpatient Encounter Medications as of 04/05/2020  Medication Sig  . AMBULATORY NON FORMULARY MEDICATION To [facility] Attn Douglas Leclair Right knee brace for genu recurvatum to assist stability and ambulation. Left ankle-foot orthosis for foot drop to assess stability and ambulation. Duration of need = lifelong.  . baclofen (LIORESAL) 20 MG tablet Take 1 tablet (20 mg total) by mouth 4 (four) times daily.  . Biotin 1000 MCG tablet Take 1,000 mcg by mouth daily.   Marland Kitchen  buPROPion (WELLBUTRIN XL) 300 MG 24 hr tablet Take 1 tablet (300 mg total) by mouth daily.  Marland Kitchen CALCIUM-VITAMIN D PO Take by mouth.  . Cholecalciferol (D3-1000) 25 MCG (1000 UT) capsule Take 1,000 Units by mouth daily.  . Clobetasol Propionate (CLOBEX SPRAY) 0.05 % external spray Clobex 0.05 % topical spray  . CLODERM 0.1 % cream APPLY TOPICALLY TO THE AFFECTED AREA EVERY DAY  . fluocinonide (LIDEX) 0.05 % external solution Apply 1 application topically as needed.  . modafinil (PROVIGIL) 200 MG tablet Take 1 tablet (200  mg total) by mouth daily.  . Multiple Vitamins-Minerals (MULTIVITAL) tablet Take 1 tablet by mouth daily.  . naproxen (NAPROSYN) 500 MG tablet Take 1 tablet (500 mg total) by mouth 2 (two) times daily as needed.  Marland Kitchen ocrelizumab 600 mg in sodium chloride 0.9 % 500 mL Inject 600 mg into the vein every 6 (six) months.  . Omega-3 Fatty Acids (FISH OIL) 1000 MG CAPS Take by mouth.  Marland Kitchen omeprazole (PRILOSEC) 40 MG capsule Take 40 mg by mouth as needed.   . Probiotic Product (ALIGN PO) Take by mouth.  . solifenacin (VESICARE) 5 MG tablet Take 1 tablet (5 mg total) by mouth daily.  . tamsulosin (FLOMAX) 0.4 MG CAPS capsule Take 1 capsule (0.4 mg total) by mouth daily.  Marland Kitchen tiZANidine (ZANAFLEX) 4 MG tablet Take 1 to 2 capsules nightly   No facility-administered encounter medications on file as of 04/05/2020.   Behavioral Observations:   Appearance: Neatly, casually and appropriately dressed and groomed. Gait: Ambulated with a cane for assistance, Severe left sided weakness observed.  Speech: Fluent; normal rate, rhythm and volume. Mild word finding difficulty. Thought process: Linear, goal directed, and logical.  Affect: Full, mildly anxious and depressed. Interpersonal: Pleasant, appropriate and polite.  60 minutes spent face-to-face with patient completing neurobehavioral status exam. 60 minutes spent integrating medical records/clinical data and completing this report. [id number] unit; [id number].   TESTING: There is medical necessity to proceed with neuropsychological assessment as the results will be used to aid in differential diagnosis and clinical decision-making and to inform specific treatment recommendations. Per the patient  and medical records reviewed, there has been a change in cognitive functioning due to a known medical disorder (MS).  Clinical Decision Making: In considering the patient's current level of functioning, level of presumed impairment, nature of symptoms, emotional and  behavioral responses during the interview, level of literacy, and observed level of motivation, a battery of tests was selected for patient to complete during scheduled testing appointment.    PLAN: The patient will return to complete the above referenced full battery of neuropsychological testing with this provider. Education regarding testing procedures was provided to the patient. Subsequently, the patient will see this provider for a follow-up session at which time her test performances and my impressions and treatment recommendations will be reviewed in detail.   Evaluation ongoing; full report to follow.

## 2020-04-07 ENCOUNTER — Other Ambulatory Visit: Payer: Self-pay | Admitting: Neurology

## 2020-04-08 ENCOUNTER — Encounter: Payer: 59 | Admitting: Psychology

## 2020-04-12 ENCOUNTER — Other Ambulatory Visit: Payer: Self-pay

## 2020-04-12 ENCOUNTER — Encounter: Payer: Self-pay | Admitting: Psychology

## 2020-04-12 ENCOUNTER — Encounter (HOSPITAL_BASED_OUTPATIENT_CLINIC_OR_DEPARTMENT_OTHER): Payer: 59 | Admitting: Psychology

## 2020-04-12 DIAGNOSIS — G35 Multiple sclerosis: Secondary | ICD-10-CM | POA: Diagnosis not present

## 2020-04-12 NOTE — Progress Notes (Signed)
   Neuropsychology Note  Meredith Hale completed 240 minutes of neuropsychological testing with this provider. The patient did not appear overtly distressed by the testing session, per behavioral observation or via self-report. Rest breaks were offered.   Clinical Decision Making: In considering the patient's current level of functioning, level of presumed impairment, nature of symptoms, emotional and behavioral responses during the interview, level of literacy, and observed level of motivation/effort, a battery of tests was selected and completed by patient.   Meredith Hale will return on 04/22/20 for an interactive feedback session with this provider at which time her test performances, clinical impressions and treatment recommendations will be reviewed in detail. The patient understands she can contact our office should she require our assistance before this time.  Full report to follow.

## 2020-04-19 ENCOUNTER — Telehealth: Payer: Self-pay | Admitting: *Deleted

## 2020-04-19 NOTE — Telephone Encounter (Signed)
Pt contacted infusion suite. Has follow up for 07/05/20 with Dr. Epimenio Foot and infusion 07/13/20. She will still be in [place] then. We rescheduled her follow up for 07/28/20 at 3pm with Dr. Epimenio Foot and Ladona Ridgel, RN will contact her about this change. She will try and schedule Ocrevus for 08/02/20.

## 2020-04-20 ENCOUNTER — Other Ambulatory Visit: Payer: Self-pay

## 2020-04-20 ENCOUNTER — Other Ambulatory Visit: Payer: Self-pay | Admitting: Neurology

## 2020-04-20 ENCOUNTER — Encounter: Payer: Self-pay | Admitting: Psychology

## 2020-04-20 ENCOUNTER — Encounter: Payer: 59 | Attending: Psychology | Admitting: Psychology

## 2020-04-20 DIAGNOSIS — F0280 Dementia in other diseases classified elsewhere without behavioral disturbance: Secondary | ICD-10-CM | POA: Diagnosis present

## 2020-04-20 DIAGNOSIS — G35 Multiple sclerosis: Secondary | ICD-10-CM | POA: Diagnosis present

## 2020-04-20 DIAGNOSIS — F02A0 Dementia in other diseases classified elsewhere, mild, without behavioral disturbance, psychotic disturbance, mood disturbance, and anxiety: Secondary | ICD-10-CM

## 2020-04-20 NOTE — Progress Notes (Addendum)
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 Average     Digit Span: 6 9 Below Average     Forward 2 <1 Exceptionally Low     Backward 7 16 Below Average     Sequencing 10 50 Average           Scaled Score Percentile    WAIS-IV Similarities: 10 50 Average  WAIS-IV Matrix Reasoning  11 63 Average         D-KEFS Color-Word Interference Test: Raw Score (Scaled Score) Percentile    Color Naming 32 secs. (9) 37 Average  Word Reading 25 secs. (9) 37 Average  Inhibition 60 secs. (10) 50 Average  Total Errors 2 errors (9) 37 Average  Inhibition/Switching 72 secs. (10) 50 Average  Total Errors 1 errors (11) 63 Average         [place] Card Sorting Test: Raw Score Percentile    Categories (trials) 6 (76) >16 Average  Total Errors 10 54 Average  % Perseverative Errors 7 50 Average  % Non-Perseverative Errors 7 46  Average  Failure to Maintain Set 0 >16 Average       Language:               Verbal Fluency Test: Raw Score (T Score) Percentile    Phonemic Fluency (FAS) 37 (39) 14 Below Average  Animal Fluency 21 (47) 38 Average           Raw Score (T Score) Percentile     [place] Naming Test (BNT): 54/60 (37) 9 Below Average         Visuospatial/Visuoconstruction:          Raw Score Percentile    RCFT, Copy: 26.5/36 <1 Exceptionally Low    Scaled Score Percentile         WAIS-IV Visual Puzzles : 15 95 Superior       HVOT T=45 32 Average         Sensory-Motor:               [place] Grooved Pegboard Test: Raw Score Percentile    Dominant Hand (Lt.) >300 secs., 5 drops  <1 Exceptionally Low  Non-Dominant Hand 110 secs., 3 drops  3 Well Below Average         Hand Dynamometer  Tapping Test: Mean Kg Percentile    Dominant Hand 2.5 <1 Exceptionally Low  Non-Dominant Hand 11.5 16 Below Average                Billing/Service Summary:   Neurobehavioral Status Exam:  Base: [id number] Add-on: [id number]  Direct clinical assessment (interview) of the patient and collateral interviews (as appropriate) by the licensed psychologist                                                                                      Total time: 120 minutes       Total units:  1 1  Neuropsychological Testing Evaluation Services:   Base: [id number] Add-on: [phone]  Records review & clarify referral question; Patient symptom management; clinical decision making/battery modification; Integration/report generation; and, post-service work   Total time:  240 minutes  Total units:  1 3  IT consultant by Licensed Psychologist:  Base: [phone] Add-on: [phone]  Test Administration (face-to-face) Scoring (Non-face-to-face)                                                                                       Total time: 240 minutes      Total units:  1 7

## 2020-04-21 ENCOUNTER — Ambulatory Visit: Payer: 59 | Attending: Neurology | Admitting: Occupational Therapy

## 2020-04-21 ENCOUNTER — Encounter: Payer: Self-pay | Admitting: Neurology

## 2020-04-21 ENCOUNTER — Other Ambulatory Visit: Payer: Self-pay

## 2020-04-21 ENCOUNTER — Ambulatory Visit (INDEPENDENT_AMBULATORY_CARE_PROVIDER_SITE_OTHER): Payer: 59 | Admitting: Neurology

## 2020-04-21 ENCOUNTER — Other Ambulatory Visit: Payer: Self-pay | Admitting: *Deleted

## 2020-04-21 VITALS — BP 128/78 | HR 102 | Ht 67.0 in | Wt 200.5 lb

## 2020-04-21 DIAGNOSIS — G35D Multiple sclerosis, unspecified: Secondary | ICD-10-CM

## 2020-04-21 DIAGNOSIS — R4189 Other symptoms and signs involving cognitive functions and awareness: Secondary | ICD-10-CM

## 2020-04-21 DIAGNOSIS — M6281 Muscle weakness (generalized): Secondary | ICD-10-CM | POA: Diagnosis present

## 2020-04-21 DIAGNOSIS — R269 Unspecified abnormalities of gait and mobility: Secondary | ICD-10-CM | POA: Diagnosis not present

## 2020-04-21 DIAGNOSIS — Z5181 Encounter for therapeutic drug level monitoring: Secondary | ICD-10-CM

## 2020-04-21 DIAGNOSIS — R29898 Other symptoms and signs involving the musculoskeletal system: Secondary | ICD-10-CM

## 2020-04-21 DIAGNOSIS — R5383 Other fatigue: Secondary | ICD-10-CM | POA: Diagnosis not present

## 2020-04-21 DIAGNOSIS — R3911 Hesitancy of micturition: Secondary | ICD-10-CM

## 2020-04-21 DIAGNOSIS — Z79899 Other long term (current) drug therapy: Secondary | ICD-10-CM

## 2020-04-21 DIAGNOSIS — R278 Other lack of coordination: Secondary | ICD-10-CM | POA: Diagnosis present

## 2020-04-21 DIAGNOSIS — G8112 Spastic hemiplegia affecting left dominant side: Secondary | ICD-10-CM | POA: Insufficient documentation

## 2020-04-21 DIAGNOSIS — G8114 Spastic hemiplegia affecting left nondominant side: Secondary | ICD-10-CM | POA: Diagnosis not present

## 2020-04-21 DIAGNOSIS — R208 Other disturbances of skin sensation: Secondary | ICD-10-CM | POA: Diagnosis present

## 2020-04-21 DIAGNOSIS — R2681 Unsteadiness on feet: Secondary | ICD-10-CM | POA: Diagnosis present

## 2020-04-21 DIAGNOSIS — G35 Multiple sclerosis: Secondary | ICD-10-CM | POA: Diagnosis not present

## 2020-04-21 MED ORDER — BACLOFEN 20 MG PO TABS
20.0000 mg | ORAL_TABLET | Freq: Four times a day (QID) | ORAL | 3 refills | Status: DC
Start: 2020-04-21 — End: 2020-08-30

## 2020-04-21 MED ORDER — TIZANIDINE HCL 4 MG PO TABS
ORAL_TABLET | ORAL | 3 refills | Status: DC
Start: 2020-04-21 — End: 2021-03-29

## 2020-04-21 MED ORDER — NAPROXEN 500 MG PO TABS
500.0000 mg | ORAL_TABLET | Freq: Two times a day (BID) | ORAL | 1 refills | Status: DC | PRN
Start: 2020-04-21 — End: 2021-03-29

## 2020-04-21 MED ORDER — OMEPRAZOLE 40 MG PO CPDR
40.0000 mg | DELAYED_RELEASE_CAPSULE | ORAL | 3 refills | Status: DC | PRN
Start: 2020-04-21 — End: 2020-04-21

## 2020-04-21 MED ORDER — OMEPRAZOLE 40 MG PO CPDR
40.0000 mg | DELAYED_RELEASE_CAPSULE | ORAL | 3 refills | Status: DC | PRN
Start: 2020-04-21 — End: 2022-05-03

## 2020-04-21 MED ORDER — WALKER MISC: 0 refills | Status: DC

## 2020-04-21 MED ORDER — TAMSULOSIN HCL 0.4 MG PO CAPS
0.4000 mg | ORAL_CAPSULE | Freq: Every day | ORAL | 3 refills | Status: DC
Start: 2020-04-21 — End: 2022-05-03

## 2020-04-21 NOTE — Progress Notes (Signed)
[facility]  PATIENT: Meredith Hale DOB: April 26, 1961  REFERRING DOCTOR OR PCP: Dr. Merita Norton [facility] neurology) SOURCE: Patient, notes from Dr. Manson Passey, imaging and lab reports   _________________________________   HISTORICAL  CHIEF COMPLAINT:  Chief Complaint  Patient presents with  . Follow-up    RM 13, alone. Last seen 01/09/2020. Left hand spasms have continued. Cannot write for long periods. Worsened prior to last infusion around April 2021. Having more eye issues, having pulsing in eyes that is intermittent. Episodes are random. Makes her a little nauseous. Getting blurry vision that is intermittent. Worse in the evenings.   . Multiple Sclerosis    On Ocrevus. Last: 01/02/20. Next: 08/02/20.   . Gait Problem    Ambulates with cane. No falls since last visit. Has had near falls.   . Trip    She is trying to leave for [place], [place] ASAP to be with family. Trying to get appt done prior to leaving.    HISTORY OF PRESENT ILLNESS:  Meredith Hale is a 59 year old woman with a relapsing form of secondary progressive MS diagnosed in 2006 though she had a Lhermitte sign in 1999.    Update 04/21/2020 She is on Ocrevus and she tolerates it well but the LFTs were increased last week.  Her last Ocrevus was 01/02/2020 and next infusion is 08/02/20.   She notes more difficulty with handwriting due to more left arm weakness and reduced fine motor control.   Sh had an OT evaluation earlier today.   She had wanted to have a documented assessment. .  She has not had any exacerbations while on Ocrevus.  However, she has had some progression of symptoms with worsening gait and reduced left hand strength.   She has reduced gait and uses a cane.   Balance is poor.  She has no falls since last visit but had some earlier this year.    She has lost fine motor skill dexterity in her dominant left hand.   Writing and preparing skills is more difficult.   Left leg also affected more .    The left fingers curl.   Due to the spasticity on the left side she takes baclofen and tizanidine with benefit for legs bu not the left arm..  She also has urinary hesitancy but no incontinence.   Review of the last brain MRI shows she has several T2 hyperintense foci in her spinal cord.     She has fatigue, worse some days than others.   Sleep is good most nights.  She notes some mild depression and decreased libido.     She takes Vit D supplements.   Her last level was 54 (normal).  She has cognitive issues including reduced word finding, short-term memory, focus/attention.  Additionally she has noted reduced ability to parallel process.  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   She stopped working in 2019 due to her progressive impairments affecting gait and fine motor control.      MS history: She was diagnosed with MS in 2006.  She was mowing the lawn on a hot day when she felt weak and dizzy.   The next morning she woke up numb form the waist down.   When she did not improve, she saw her doctor.   She had MRIs and a lumbar puncture.   The MRI showed lesions consistent with MS,   She had oligoclonal bands in her CSF.  Initially, she was placed on Betaseron but had side effects causing body aches.  Then she was placed on Copaxone and switched to Tecfidera after having an exacerbation.  She tolerated Tecfidera well but her lymphocyte count dropped and she was switched to Aubagio.  More recently she switched from Aubagio to Ocrevus to be on a more effective medication.   She tolerates it well and her last infusion was Nov 30, 2017.   Her first infusion was November 2018.      Imaging studies: January 2018 MRI of the brain and cervical spine:   The brain showed several white matter foci which could be consistent with MS.  The cervical spinal cord showed at least 5 foci c/w demyelination.     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    06/25/2018 MRI of the brain and  cervical spine: The brain is unchanged.  The cervical spine is unchanged.  There is a mild Chiari malformation with 6-7 mm of left cerebellar ectopia.   The spinal cord is of normal caliber.    There are T2 hyperintense foci anteromedially adjacent to C2, posterior to the left adjacent to C2, laterally to the right adjacent to C2-C3, lateral to the left adjacent to C4 and posterolateral to the left adjacent to C4-C5.  Additionally, there are degenerative changes at C4-C5 through C6-C7.  03/21/2019 MRI: The MRI of the brain showed similar pattern of T2/FLAIR hyperintense foci in the hemispheres.  There is 8 mm cerebellar ectopia consistent with a Chiari I malformation and increased compared to the previous MRI.  (This was performed after neurologic symptoms that followed an epidural steroid injection)  Other: She has scoliosis causing some back pain and has been told it has worsened.   NCV/EMG 02/25/2019 shows mild left ulnar neuropathy, not severe enough to explain atrophy in the left hand.  Reduced recruitment in the C7 and C8 myotomes most consistent with a chronic demyelinating plaque involving the anterior horn cells.    REVIEW OF SYSTEMS: Constitutional: No fevers, chills, sweats, or change in appetite.  She has fatigue. Eyes: No visual changes, double vision, eye pain.  There is light sensitivity. Ear, nose and throat: No hearing loss, ear pain, nasal congestion, sore throat.  She has tinnitus. Cardiovascular: No chest pain, palpitations Respiratory: No shortness of breath at rest or with exertion.   No wheezes GastrointestinaI: No nausea, vomiting, diarrhea, abdominal pain, fecal incontinence Genitourinary: No dysuria, urinary retention or frequency.  No nocturia. Musculoskeletal: No neck pain, back pain.   She has scoliosis.   Integumentary: No rash, pruritus currently though she does have psoriasis and some skin moles Neurological: as above Psychiatric: No depression at this time.  No  anxiety Endocrine: No palpitations, diaphoresis, change in appetite, change in weigh or increased thirst Hematologic/Lymphatic: No anemia, purpura, petechiae. Allergic/Immunologic: No itchy/runny eyes, nasal congestion, recent allergic reactions, rashes  ALLERGIES: Allergies  Allergen Reactions  . Colophony [Pinus Strobus]   . Formaldehyde   . Propylene Glycol   . Quaternium-15     HOME MEDICATIONS:  Current Outpatient Medications:  .  AMBULATORY NON FORMULARY MEDICATION, To Restore Prosthetics and Orthotics Attn Douglas Leclair Right knee brace for genu recurvatum to assist stability and ambulation. Left ankle-foot orthosis for foot drop to assess stability and ambulation. Duration of need = lifelong., Disp: 1 each, Rfl: 0 .  baclofen (LIORESAL) 20 MG tablet, Take 1 tablet (20 mg total) by mouth 4 (four) times daily., Disp: 360  tablet, Rfl: 3 .  Biotin 1000 MCG tablet, Take 1,000 mcg by mouth daily. , Disp: , Rfl:  .  buPROPion (WELLBUTRIN XL) 300 MG 24 hr tablet, Take 1 tablet (300 mg total) by mouth daily., Disp: 90 tablet, Rfl: 3 .  CALCIUM-VITAMIN D PO, Take by mouth., Disp: , Rfl:  .  Cholecalciferol (D3-1000) 25 MCG (1000 UT) capsule, Take 1,000 Units by mouth daily., Disp: , Rfl:  .  Clobetasol Propionate (CLOBEX SPRAY) 0.05 % external spray, Clobex 0.05 % topical spray, Disp: , Rfl:  .  CLODERM 0.1 % cream, APPLY TOPICALLY TO THE AFFECTED AREA EVERY DAY, Disp: , Rfl:  .  fluocinonide (LIDEX) 0.05 % external solution, Apply 1 application topically as needed., Disp: , Rfl:  .  modafinil (PROVIGIL) 200 MG tablet, Take 1 tablet (200 mg total) by mouth daily., Disp: 90 tablet, Rfl: 1 .  Multiple Vitamins-Minerals (MULTIVITAL) tablet, Take 1 tablet by mouth daily., Disp: , Rfl:  .  naproxen (NAPROSYN) 500 MG tablet, Take 1 tablet (500 mg total) by mouth 2 (two) times daily as needed., Disp: 180 tablet, Rfl: 1 .  ocrelizumab 600 mg in sodium chloride 0.9 % 500 mL, Inject 600 mg into  the vein every 6 (six) months., Disp: , Rfl:  .  Omega-3 Fatty Acids (FISH OIL) 1000 MG CAPS, Take by mouth., Disp: , Rfl:  .  Probiotic Product (ALIGN PO), Take by mouth., Disp: , Rfl:  .  solifenacin (VESICARE) 5 MG tablet, TAKE 1 TABLET BY MOUTH EVERY DAY, Disp: 90 tablet, Rfl: 3 .  tamsulosin (FLOMAX) 0.4 MG CAPS capsule, Take 1 capsule (0.4 mg total) by mouth daily., Disp: 90 capsule, Rfl: 3 .  tiZANidine (ZANAFLEX) 4 MG tablet, Take 1 to 2 capsules nightly, Disp: 180 tablet, Rfl: 3 .  Misc. Devices (WALKER) MISC, Upright walker for MS and gait disturbance.    G35 and R26.9, Disp: 1 each, Rfl: 0 .  omeprazole (PRILOSEC) 40 MG capsule, Take 1 capsule (40 mg total) by mouth as needed., Disp: 90 capsule, Rfl: 3  PAST MEDICAL HISTORY: Past Medical History:  Diagnosis Date  . Multiple sclerosis (HCC)   . Psoriasis   . Scoliosis     PAST SURGICAL HISTORY: Past Surgical History:  Procedure Laterality Date  . AUGMENTATION MAMMAPLASTY    . bone navicular  07/05/2006  . FINGER GANGLION CYST EXCISION    . FINGER SURGERY  06/28/2015   capsular release due to injury  . FOOT SURGERY     right and left foot  . FOOT SURGERY    . HEMORRHOIDECTOMY WITH HEMORRHOID BANDING  04/19/2017  . LUMBAR EPIDURAL INJECTION  04/27/2017  . lymph node removal  1976  . REFRACTIVE SURGERY  06/02/1994  . 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 Marital status: Single    Spouse name: Not on file  . Number of children: 0  . Years of education: Masters  . Highest education level: Not on file  Occupational History  . Occupation: unemployed  Tobacco Use  . Smoking status: Former Smoker    Packs/day: 1.00  . Smokeless tobacco: Never Used  Vaping Use  . Vaping Use: Never used  Substance and Sexual Activity  .  Alcohol use: Yes    Comment:  1 per week  . Drug use: Never  . Sexual activity: Yes    Partners: Male  Other Topics Concern  . Not on file  Social History Narrative   Lives w/ significant other, Sharen Hint   Caffeine use: 2 cups per day   Left handed but transitioning to be right handed d/t being weak in left hand   Social Determinants of Health   Financial Resource Strain:   . Difficulty of Paying Living Expenses: Not on file  Food Insecurity:   . Worried About Programme researcher, broadcasting/film/video in the Last Year: Not on file  . Ran Out of Food in the Last Year: Not on file  Transportation Needs:   . Lack of Transportation (Medical): Not on file  . Lack of Transportation (Non-Medical): Not on file  Physical Activity:   . Days of Exercise per Week: Not on file  . Minutes of Exercise per Session: Not on file  Stress:   . Feeling of Stress : Not on file  Social Connections:   . Frequency of Communication with Friends and Family: Not on file  . Frequency of Social Gatherings with Friends and Family: Not on file  . Attends Religious Services: Not on file  . Active Member of Clubs or Organizations: Not on file  . Attends Banker Meetings: Not on file  . Marital Status: Not on file  Intimate Partner Violence:   . Fear of Current or Ex-Partner: Not on file  . Emotionally Abused: Not on file  . Physically Abused: Not on file  . Sexually Abused: Not on file     PHYSICAL EXAM  Vitals:   04/21/20 1539  BP: 128/78  Pulse: (!) 102  Weight: 200 lb 8 oz (90.9 kg)  Height: 5\' 7"  (1.702 m)    Body mass index is 31.4 kg/m.   General: The patient is well-developed and well-nourished and in no acute distress   Neck: The neck is supple, no carotid bruits are noted.  She has mild tenderness midline at the skull base   Skin: Extremities are without rash or edema.  Neurologic Exam  Mental status: The patient is alert and oriented x 3 at the time of the examination. The patient  has apparent normal recent and remote memory, with an apparently normal attention span and concentration ability.   Speech is normal.  Cranial nerves: Extraocular movements are full.   There is good facial sensation to soft touch bilaterally.Facial strength is normal.  Trapezius and sternocleidomastoid strength is normal. No dysarthria is noted. No obvious hearing deficits are noted.  Motor:  There is some increased muscle tone in the left leg.. Strength is  5 / 5 on the right arm and 4+/5 right leg.   Strength is 4-/5 in the left arm except 2+ to 3/5 in the intrinsic hand muscles.  She has atrophy of the intrinsic hand muscles..  Strength is 2-/5 in the left iliopsoas, 2+/5 in the left quadriceps, 3/5 in the left hamstrings, 2-/5 last ankle extension  Sensory: She has intact sensation to touch sensation.  She has reduced sensation to vibration in the left arm and leg relative to the right.  Coordination: Cerebellar testing reveals reduced left finger-nose-finger and she can't do left heel-to-shin .  She has mildly reduced right heel-to-shin.  Gait and station: Station is normal.  She is wearing AFO braces.  She has very poor gait due to the left weakness.  She is unable to walk more  than a few feet without the use of her cane or support..  She is unable to do a tandem walk.  The Romberg is borderline..   Reflexes: Deep tendon reflexes are symmetric and increased on the left relative to the right.   Plantar responses are flexor on the right and extensor on the left.  ______________________________________________________  ASSESSMENT AND PLAN  1. Multiple sclerosis (HCC)   2. Left spastic hemiplegia (HCC)   3. Abnormal gait   4. Other fatigue   5. Urinary hesitancy   6. Alteration in cognition   7. Left hand weakness   8. Encounter for monitoring immunomodulating therapy     1.   She will continue Ocrevus for her relapsing remitting MS.   Her next infusion is in January 2022.   We will  check an MRI of the brain and cervical spine to determine if there has been additional progression.  If present, consider a different disease modifying agent. 2.   She is permanently disabled due to a combination of physical (weakness, spasticity and reduced coordination of dominant left side) and cognitive issues (fatigue, reduced ability to multitask, memory disturbance) related to her MS, she is permanently disabled.  Her EDSS is 6.0 even wearing AFO brace. 3.   Wellbutrin XL 300 mg.   4.   She will return to see me in 6 months but sooner if there are new or worsening neurologic symptoms.  45-minute office visit with the majority of the time spent face-to-face for history and physical, discussion/counseling and decision-making.  Additional time with record review and documentation.    Meredith Hale A. Epimenio Foot, MD, PhD, FAAN Certified in Neurology, Clinical Neurophysiology, Sleep Medicine, Pain Medicine and Neuroimaging Director, [facility] at [facility]  [facility] [address] [place], [place] [address] [phone]

## 2020-04-21 NOTE — Therapy (Addendum)
[facility] [address] [place], [place], [address] Phone: [phone]   Fax:  [phone]  Occupational Therapy Evaluation  Patient Details  Name: Meredith Hale MRN: [id number] Date of Birth: 02/10/2007 Referring Provider (OT): Dr. Epimenio Foot   Encounter Date: 04/21/2020   OT End of Session - 04/21/20 1037    Visit Number 1    Number of Visits 5    Date for OT Re-Evaluation 08/15/20    Authorization Type [facility], VL: 30 PT/OT/ST combined, no auth required    OT Start Time 0932    OT Stop Time 1025    OT Time Calculation (min) 53 min    Activity Tolerance Patient tolerated treatment well    Behavior During Therapy [facility] for tasks assessed/performed           Past Medical History:  Diagnosis Date  . Multiple sclerosis (HCC)   . Psoriasis   . Scoliosis     Past Surgical History:  Procedure Laterality Date  . AUGMENTATION MAMMAPLASTY    . bone navicular  07/05/2006  . FINGER GANGLION CYST EXCISION    . FINGER SURGERY  06/28/2015   capsular release due to injury  . FOOT SURGERY     right and left foot  . FOOT SURGERY    . HEMORRHOIDECTOMY WITH HEMORRHOID BANDING  04/19/2017  . LUMBAR EPIDURAL INJECTION  04/27/2017  . lymph node removal  1976  . REFRACTIVE SURGERY  06/02/1994  . 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   Subjective Assessment - 04/21/20 0937    Subjective  I am here to get a baseline for my Lt arm   Pertinent History Relapsing remitting MS diagnosed in 2006, scoliosis    Currently in Pain? Yes    Pain Score 4     Pain Location Hand    Pain Orientation Left    Pain Descriptors / Indicators Aching    Pain Type Chronic pain    Pain Onset More than a month ago    Pain Frequency Intermittent    Aggravating Factors  cold and/or rainy weather    Pain Relieving Factors pain meds              [facility] OT Assessment - 04/21/20 0001      Assessment   Medical Diagnosis MS   relapsing remitting type   Referring Provider (OT) Dr. Epimenio Foot    Onset Date/Surgical Date --   2006   Hand Dominance Left   have learned to use Rt hand   Next MD Visit 04/21/20    Prior Therapy PT/OT last in 2018      Precautions   Precautions Fall      Restrictions   Weight Bearing Restrictions No      Balance Screen   Has the patient fallen in the past 6 months Yes    How many times? 2    Has the patient had a decrease in activity level because of a fear of falling?  Yes    Is the patient reluctant to leave their home because of a fear of falling?  No      Home  Environment   Bathroom Shower/Tub Tub/Shower unit    Shower/tub characteristics Curtain    Home Equipment Grab bars - tub/shower;Tub bench;Cane - single point   asking for upright walker   Additional  Comments Pt lives in 2 story home with 3 steps to enter with bedroom and bathroom on 2nd floor. 1/2 bath on first floor    Lives With Significant other      Prior Function   Level of Independence Independent with basic ADLs   but reports this is declining   Vocation Unemployed   since 2019   Vocation Requirements Pt was in retail prior to 2019    Leisure pt enjoyed volunteering but now limited due to fatigue levels      ADL   Eating/Feeding Needs assist with cutting food   eating with Rt non dominant hand   Grooming Modified independent   w/ Rt non dominant hand, fatigues for styling hair & needs    Upper Body Bathing Modified independent   seated on bench   Lower Body Bathing Modified independent   seated on bench   Upper Body Dressing Increased time   difficulty w/ fastners   Lower Body Dressing Increased time   difficulty w/ tying shoes,    Toilet Transfer Modified independent    Toileting - Clothing Manipulation Modified independent    Toileting -  Hygiene Modified Independent    Tub/Shower Transfer Modified independent      IADL    Shopping Shops independently for small purchases   otherwise does pick up for groceries   Light Housekeeping Does personal laundry completely;Performs light daily tasks such as dishwashing, bed making   S.O. changes sheets, carrys laundry, vacuums   Meal Prep Able to complete simple warm meal prep;Able to complete simple cold meal and snack prep   boyfriend does most cooking, pt can heat up items   Community Mobility Drives own vehicle    Medication Management Is responsible for taking medication in correct dosages at correct time      Mobility   Mobility Status Comments walks mainly with cane or loftstrand crutches      Written Expression   Dominant Hand Left    Handwriting 75% legible   Lt hand w/ lateral pinch, fatigues quickly     Vision - History   Baseline Vision Wears glasses only for reading    Additional Comments Pt reports light sensitivity and blurred vision from MS      Activity Tolerance   Activity Tolerance Comments Pt tolerates approx 15 minutes of activity before needing rest (depending on activity). Pt fatigues after changing Insurance risk surveyor Comments Refer to neuropsychology/cognitive testing. Pt does report trouble with word choice/word finding      Observation/Other Assessments   Observations Pt walks w/ cane or crutches, requesting walker. Pt presents with approx 75% gross finger flexion Lt hand and she reports it curls up - has full P/ROM in finger extension. Full AFO Lt LE, shorter ankle stability orthotic Rt LE.       Sensation   Light Touch Appears Intact    Additional Comments Intact BUE's for localization but reports diminshed feeling and occasional tingling Lt hand      Coordination   Gross Motor Movements are Fluid and Coordinated No    Fine Motor Movements are Fluid and Coordinated No    9 Hole Peg Test Right;Left    Right 9 Hole Peg Test 19.31 sec    Left 9 Hole Peg Test 80.43 sec    using compensations/lateral pinch   Box and  Blocks Rt = 52, Lt = 20 using lateral pinch    Coordination Pt  unable to type with Lt hand      Edema   Edema none in UE's, Lt foot minimal      Tone   Assessment Location Left Upper Extremity      ROM / Strength   AROM / PROM / Strength AROM      AROM   Overall AROM Comments RUE AROM WNL's. LUE: Sh flex to 95* with compensations into sh abduction, IR, and forearm pronation. Elbow flex WFLs, but limited extension to -40*. Wrist and foream ROM WFL's however hand stayed curled into approx 75% gross finger flexion and thumb adducted. Pt can actively extend fingers 60-70%. Pt limited thumb palmer abduction for functional grasp. Pt has full P/ROM LUE/hand       Hand Function   Right Hand Grip (lbs) 16 lbs    Left Hand Grip (lbs) less than 5 lbs      LUE Tone   LUE Tone Mild;Modified Ashworth      LUE Tone   Modified Ashworth Scale for Grading Hypertonia LUE Slight increase in muscle tone, manifested by a catch and release or by minimal resistance at the end of the range of motion when the affected part(s) is moved in flexion or extension   with finger extension                          OT Education - 04/21/20 1051    Education Details OT findings, POC/Goals    Person(s) Educated Patient    Methods Explanation    Comprehension Verbalized understanding               OT Long Term Goals - 04/21/20 1049      OT LONG TERM GOAL #1   Title Pt will be independent with splint wear and care for Lt hand (resting hand for pm, possible neoprene thumb splint for daytime)    Time 4    Period Weeks    Status New      OT LONG TERM GOAL #2   Title Pt will verbalize understanding of any A/E needs and task modifications for greater ease and safety with ADLS (Specifically for shoes, buttons, hairdryer holder, one handed cutting board, rocker knife, pot stablizier)    Time 4    Period Weeks    Status New      OT LONG TERM GOAL #3   Title Pt will verbalize understanding  with energy conservation techniques and MS support groups    Time 4    Period Weeks    Status New                 Plan - 04/21/20 1038    Clinical Impression Statement Pt is a 59 y.o. 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 Pt reports she is here mainly for an assessment to get a baseline for LUE. However pt would benefit from some O.T. to address any A/E needs, splinting needs, and energy conservation techniques. Pt agreeable to this but reports she may be traveling out of town soon to be with family and may not return until the end of this year. Will keep chart open for 3 months to return when able.    OT Occupational Profile and History Problem Focused Assessment - Including review of records relating to presenting problem    Occupational performance deficits (Please refer to evaluation for details): ADL's;IADL's;Work  Body Structure / Function / Physical Skills ADL;UE functional use;Pain;ROM;Coordination;Sensation;IADL;Mobility;Strength;Dexterity;Body mechanics;Balance   endurance   Rehab Potential Good   for goals set   Clinical Decision Making Several treatment options, min-mod task modification necessary    Comorbidities Affecting Occupational Performance: Presence of comorbidities impacting occupational performance    Comorbidities impacting occupational performance description: MS    Modification or Assistance to Complete Evaluation  Min-Moderate modification of tasks or assist with assess necessary to complete eval    OT Frequency 2x / week    OT Duration 4 weeks   anticipate only 2 weeks needed for goals set   OT Treatment/Interventions Self-care/ADL training;Energy conservation;DME and/or AE instruction;Patient/family education;Passive range of motion;Functional Mobility Training;Coping strategies training;Therapeutic activities;Manual Therapy;Therapeutic exercise;Neuromuscular  education    Plan resting hand splint for Lt hand for pm, possible neoprene thumb splint for daytime. Will hold chart open for 3 months because pt may be going out of town till the end of this year.    Consulted and Agree with Plan of Care Patient           Patient will benefit from skilled therapeutic intervention in order to improve the following deficits and impairments:   Body Structure / Function / Physical Skills: ADL, UE functional use, Pain, ROM, Coordination, Sensation, IADL, Mobility, Strength, Dexterity, Body mechanics, Balance (endurance)       Visit Diagnosis: Spastic hemiplegia affecting left dominant side, unspecified etiology (HCC)  Other lack of coordination  Muscle weakness (generalized)  Unsteadiness on feet  Other disturbances of skin sensation    Problem List Patient Active Problem List   Diagnosis Date Noted  . Psoriasis 11/21/2019  . Abnormal gait 05/19/2019  . 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 Multiple sclerosis (HCC) 01/21/2019  . Left spastic hemiplegia (HCC) 01/21/2019  . Other fatigue 01/21/2019  . Urinary hesitancy 01/21/2019  . Vitamin D deficiency 01/21/2019  . Normal gynecologic examination 08/16/2018    Kelli Churn, OTR/L 04/21/2020, 10:52 AM  [facility] [address] [place], [place], [address] Phone: [phone]   Fax:  [phone]  Name: Kiarrah Rausch MRN: [id number] Date of Birth: [birth year]

## 2020-04-22 ENCOUNTER — Encounter (HOSPITAL_BASED_OUTPATIENT_CLINIC_OR_DEPARTMENT_OTHER): Payer: 59 | Admitting: Psychology

## 2020-04-22 ENCOUNTER — Other Ambulatory Visit (HOSPITAL_COMMUNITY)
Admission: RE | Admit: 2020-04-22 | Discharge: 2020-04-22 | Disposition: A | Discharge status: Home / Self Care | Payer: 59 | Source: Ambulatory Visit | Attending: Osteopathic Medicine | Admitting: Osteopathic Medicine

## 2020-04-22 ENCOUNTER — Telehealth: Payer: Self-pay | Admitting: Neurology

## 2020-04-22 ENCOUNTER — Encounter: Payer: Self-pay | Admitting: Osteopathic Medicine

## 2020-04-22 ENCOUNTER — Ambulatory Visit (INDEPENDENT_AMBULATORY_CARE_PROVIDER_SITE_OTHER): Payer: 59 | Admitting: Osteopathic Medicine

## 2020-04-22 ENCOUNTER — Other Ambulatory Visit: Payer: Self-pay

## 2020-04-22 VITALS — BP 113/73 | HR 73 | Temp 98.0°F | Wt 198.0 lb

## 2020-04-22 DIAGNOSIS — F0280 Dementia in other diseases classified elsewhere without behavioral disturbance: Secondary | ICD-10-CM | POA: Diagnosis not present

## 2020-04-22 DIAGNOSIS — Z124 Encounter for screening for malignant neoplasm of cervix: Secondary | ICD-10-CM | POA: Diagnosis present

## 2020-04-22 DIAGNOSIS — Z23 Encounter for immunization: Secondary | ICD-10-CM

## 2020-04-22 DIAGNOSIS — F02A0 Dementia in other diseases classified elsewhere, mild, without behavioral disturbance, psychotic disturbance, mood disturbance, and anxiety: Secondary | ICD-10-CM

## 2020-04-22 DIAGNOSIS — N898 Other specified noninflammatory disorders of vagina: Secondary | ICD-10-CM

## 2020-04-22 DIAGNOSIS — G35 Multiple sclerosis: Secondary | ICD-10-CM | POA: Diagnosis not present

## 2020-04-22 LAB — HEPATIC FUNCTION PANEL
ALT: 67 IU/L — ABNORMAL HIGH (ref 0–32)
AST: 37 IU/L (ref 0–40)
Albumin: 4.3 g/dL (ref 3.8–4.9)
Alkaline Phosphatase: 112 IU/L (ref 44–121)
Bilirubin Total: 0.3 mg/dL (ref 0.0–1.2)
Bilirubin, Direct: 0.1 mg/dL (ref 0.00–0.40)
Total Protein: 7.4 g/dL (ref 6.0–8.5)

## 2020-04-22 LAB — SAR COV2 SEROLOGY (COVID19)AB(IGG),IA: DiaSorin SARS-CoV-2 Ab, IgG: NEGATIVE

## 2020-04-22 NOTE — Telephone Encounter (Signed)
no to the covid questions MR Brain w/wo contrast & MR Cervical spine w/wo contrast Dr. Valda [facility] auth: [id number] & [id number] (exp. 04/22/20 to 07/21/20) . Patient is scheduled at [facility] for 04/28/20.

## 2020-04-22 NOTE — Telephone Encounter (Signed)
[facility] is asking for a call to discuss the discontinuation of the omeprazole (PRILOSEC) 40 MG capsule Please call [phone] use [id number]

## 2020-04-22 NOTE — Telephone Encounter (Signed)
[facility] pending  

## 2020-04-22 NOTE — Progress Notes (Signed)
Meredith Hale is a 59 y.o. female who presents to  [facility]  today, 04/23/20, seeking care for the following:  . Pap - routine  . Urine concern - greenish vaginal discharge and intermittent dysuria past week or two      ASSESSMENT & PLAN with other pertinent findings:  The primary encounter diagnosis was Cervical cancer screening. Diagnoses of Vaginal discharge and Flu vaccine need were also pertinent to this visit.   Patient Instructions  Await swab results If abnormal - will treat based on those findings If normal but symptoms persist - possible false negative, and I'd just go ahead and treat for BV and yeast  Urine looks ok, but will send for culture to rule out urinary tract infection  Let me know if anything changes or gets worse!      Orders Placed This Encounter  Procedures  . SureSwab, Vaginosis/Vaginitis Plus  . Urine Culture  . Flu Vaccine QUAD 6+ mos PF IM (Fluarix Quad PF)  . POCT URINALYSIS DIP (CLINITEK)    No orders of the defined types were placed in this encounter.      Follow-up instructions: Return in about 1 year (around 04/22/2021) for [place] (call week prior to visit for lab orders), see me sooner if needed! .                                         BP 113/73 (BP Location: Left Arm, Patient Position: Sitting)   Pulse 73   Temp 98 F (36.7 C)   Wt 198 lb (89.8 kg)   SpO2 98%   BMI 31.01 kg/m   Current Meds  Medication Sig  . AMBULATORY NON FORMULARY MEDICATION To [facility] Attn Douglas Leclair Right knee brace for genu recurvatum to assist stability and ambulation. Left ankle-foot orthosis for foot drop to assess stability and ambulation. Duration of need = lifelong.  . baclofen (LIORESAL) 20 MG tablet Take 1 tablet (20 mg total) by mouth 4 (four) times daily.  . Biotin 1000 MCG tablet Take 1,000 mcg by mouth daily.    Marland Kitchen buPROPion (WELLBUTRIN XL) 300 MG 24 hr tablet Take 1 tablet (300 mg total) by mouth daily.  Marland Kitchen CALCIUM-VITAMIN D PO Take by mouth.  . Cholecalciferol (D3-1000) 25 MCG (1000 UT) capsule Take 1,000 Units by mouth daily.  . Clobetasol Propionate (CLOBEX SPRAY) 0.05 % external spray Clobex 0.05 % topical spray  . CLODERM 0.1 % cream APPLY TOPICALLY TO THE AFFECTED AREA EVERY DAY  . fluocinonide (LIDEX) 0.05 % external solution Apply 1 application topically as needed.  . Misc. Devices (WALKER) MISC Upright walker for MS and gait disturbance.    G35 and R26.9  . modafinil (PROVIGIL) 200 MG tablet Take 1 tablet (200 mg total) by mouth daily.  . Multiple Vitamins-Minerals (MULTIVITAL) tablet Take 1 tablet by mouth daily.  . naproxen (NAPROSYN) 500 MG tablet Take 1 tablet (500 mg total) by mouth 2 (two) times daily as needed.  Marland Kitchen ocrelizumab 600 mg in sodium chloride 0.9 % 500 mL Inject 600 mg into the vein every 6 (six) months.  . Omega-3 Fatty Acids (FISH OIL) 1000 MG CAPS Take by mouth.  Marland Kitchen omeprazole (PRILOSEC) 40 MG capsule Take 1 capsule (40 mg total) by mouth as needed.  . Probiotic Product (ALIGN PO) Take by mouth.  Marland Kitchen  solifenacin (VESICARE) 5 MG tablet TAKE 1 TABLET BY MOUTH EVERY DAY  . tamsulosin (FLOMAX) 0.4 MG CAPS capsule Take 1 capsule (0.4 mg total) by mouth daily.  Marland Kitchen tiZANidine (ZANAFLEX) 4 MG tablet Take 1 to 2 capsules nightly    Results for orders placed or performed in visit on 04/22/20 (from the past 72 hour(s))  POCT URINALYSIS DIP (CLINITEK)     Status: Abnormal   Collection Time: 04/23/20  9:23 AM  Result Value Ref Range   Color, UA yellow yellow   Clarity, UA clear clear   Glucose, UA negative negative mg/dL   Bilirubin, UA negative negative   Ketones, POC UA negative negative mg/dL   Spec Grav, UA 6.734 1.937 - 1.025   Blood, UA negative negative   pH, UA 6.0 5.0 - 8.0   POC PROTEIN,UA negative negative, trace   Urobilinogen, UA 0.2 0.2 or 1.0 E.U./dL   Nitrite, UA  Negative Negative   Leukocytes, UA Small (1+) (A) Negative    No results found.     All questions at time of visit were answered - patient instructed to contact office with any additional concerns or updates.  ER/RTC precautions were reviewed with the patient as applicable.   Please note: voice recognition software was used to produce this document, and typos may escape review. Please contact Dr. Lyn Hollingshead for any needed clarifications.

## 2020-04-22 NOTE — Patient Instructions (Signed)
Await swab results If abnormal - will treat based on those findings If normal but symptoms persist - possible false negative, and I'd just go ahead and treat for BV and yeast  Urine looks ok, but will send for culture to rule out urinary tract infection  Let me know if anything changes or gets worse!

## 2020-04-22 NOTE — Telephone Encounter (Signed)
Called back to further discuss. Spoke with Chavonne. She transferred me to prescriber contact. Was unable to get someone on the line. She put me back on hold. Riki Rusk picked up call. I provided verbal below. He verbalized understanding, nothing further needed.  Confirmed with Dr. Epimenio Foot that he refilled this medication for pt yesterday, it should not be d/c'd. Directions: 1 capsule by mouth daily as needed.

## 2020-04-23 LAB — POCT URINALYSIS DIP (CLINITEK)
Bilirubin, UA: NEGATIVE
Blood, UA: NEGATIVE
Glucose, UA: NEGATIVE mg/dL
Ketones, POC UA: NEGATIVE mg/dL
Nitrite, UA: NEGATIVE
POC PROTEIN,UA: NEGATIVE
Spec Grav, UA: 1.01 (ref 1.010–1.025)
Urobilinogen, UA: 0.2 E.U./dL
pH, UA: 6 (ref 5.0–8.0)

## 2020-04-23 LAB — URINE CULTURE
MICRO NUMBER:: 11045153
Result:: NO GROWTH
SPECIMEN QUALITY:: ADEQUATE

## 2020-04-23 NOTE — Progress Notes (Addendum)
Neuropsychology Feedback Appointment  Meredith Hale returned for a feedback appointment today to review the results of Meredith Hale recent neuropsychological evaluation with this provider. 60 minutes face-to-face time was spent reviewing Meredith Hale test results, my impressions and my recommendations as detailed in Meredith Hale report. Education was provided about Multiple Sclerosis and possible brain stimulation interventions. The patient was given the opportunity to ask questions, and I did my best to answer these to Meredith Hale satisfaction. Written materials on Multiple Sclerosis was provided. We scheduled a 52-month follow up appointment to assess neuropsychological status over time, as well as to document any potential improvement or decline in cognitive functioning. Lastly, any follow-up testing will help delineate the specific cognitive basis of any new functional complaints.  Below you will find a summary of Meredith Hale test results, my clinical impressions, and recommendations. The full neuropsychological report can be found in Meredith Hale chart dated 04/20/20 and the initial interview/consultation on 04/05/20   TEST RESULTS:  A detailed score summary is provided within the Scores Summary Table at the end of this report, presented as standardized scores obtained through comparisons of the patient's performance to that of same age peers taking into account (whenever possible) the effects of age, education, and other demographic factors.    Baseline Intellectual Abilities: Performance on a measure of word reading combined with demographic information yielded a statistically-derived estimate of premorbid intellectual functioning.  The patient's premorbid abilities are estimated to be in the average.    Attention, Processing Speed, and Executive Cognitive Processes: Response inhibition was average. Word naming speed was average. Color naming speed was average. Visuomotor cognitive information processing speed was average. Immediate auditory  attention was average, working memory capacity was average, and mental sequencing was average.Marland Kitchen Psychomotor speed involving simple sequencing and visual scanning was above average. More complex sequencing requiring divided attention, mental flexibility, and set shifting was above average. The patient's ability to identify, maintain, and shift problem solving strategies in response to examiner feedback was average.    Language Functions: The patient's speech was fluent and grammar and syntax were unremarkable. No major word-finding problems were observed. Speech comprehension appeared intact. Letter fluency was below average. Category fluency was average. Confrontation naming was below average.    Visuospatial and Constructional Abilities: Complex constructional praxis was impaired. Visual organization was average.    Learning and Memory: On a rote verbal list-learning task, overall verbal learning was average Short delayed free recall was above average. Short delayed cued recall was average. Long-delayed free recall was average. Long-delayed cued recall was average. Verbal recognition was average (15/16 hits and 0 false positive errors). On a test measuring visuospatial memory, immediate recall was below average. Delayed recall was average. Visual recognition was average.    Motor Coordination: Severely impaired fine and gross motor coordination on dominant side (left).   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 However, there are a few areas of relative weakness and impairment that are consistent with Meredith Hale autoimmune/demyelinating disorder. A diagnosis mild major neurocognitive disorder is appropriate at this time.   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 Most moderate to complicated tasks would be difficult for Meredith Hale to complete. Meredith Hale would likely experience complications performing basic job operations (e.g., documentation, maintaining data, etc.) due to Meredith Hale motor deficits and variable attention. She may have problems encoding instructions or responsibilities on the job. She required repetition of instructions throughout testing, which depicts Meredith Hale inability to quickly and proficiently process information. Again, in a workplace this would manifest as an inability to understand Meredith Hale job and responsibilities and require constant supervision to ensure quality and completed work. She would require constant prompting as to what she should be doing and to do so at an efficient pace; this is not available in most work environments. She is prone to difficulties in handling demands, making correct decisions, avoiding errors, meeting time requirements and deadlines, being responsible, and handling overall job requirements. Even with supervision, she would be prone to making errors and be less efficient overall, due to motor deficits affecting Meredith Hale dominant side. Given that she has a chronic CNS disease with uncertain future course, any employer considering Meredith Hale for hire would have to prepare for possibility that Meredith Hale functioning may further decline, even suddenly and drastically, after she starts. They might also have to lower their standards of performance and provide additional supervision to ensure safety and production.   Meredith Hale current level of cognitive functioning make Meredith Hale ineligible for full time work. While she may be able to hold low level work for a short period, Meredith Hale neurocognitive status may prevent Meredith Hale from maintaining full time employment. In theory, she can conceivably work for an employer who is willing to accept all of these restrictions, stipulations, and provide a highly structured and supervised environment. However, the likelihood that this opportunity exists is very low and is not reliable for sustained full time work.  RECOMMENDATIONS: 1. Follow-up with Dr. Epimenio Foot and Dr. Lyn Hollingshead . No treatment for cognition warranted.   2. Ms. Hendel cognitive functioning was markedly impaired or weak in multiple aspects. Owing to gross and fine motor deficits, weakness in attention, and reduced verbal fluency, she would likely have significant difficulty performing any tasks involving fine motor ability or holding/carrying even light weighted objects; she has scoliosis as well which further impairs Meredith Hale ability to ambulate without assistance. Reduced attention and chronic fatigue would most likely caused trouble learning procedures in order to complete job duties for a new job. Ms. Sadiq should be considered fully disabled from work at this time.   3. Non-pharmacological treatments for MS:  1. Exercise and stretching 2. Meditation, mindfulness, and yoga 3. Physical Therapy/Occupational therapy 4. Cognitive training (e.g., computer based) 5. Brain stimulation (e.g., TMS, tDCS) 1. Studies indicate that TMS and tDCS stimulation methods of the anterior temporal lobe help to enhance speed of recall. Moreover, tDCS can enhance performance of working memory tasks, learning and recall of words during stimulation of the left dorso-lateral prefrontal cortex (DLPFC) during encoding. TMS and tDCS are suggested as therapies that can enhance cognitive skills due to stimulation of brain plasticity processes and  facilitating of learning motor tasks by activation motor areas needed to rehabilitation. Headaches, local pain, confusion and occurrence of seizure are the most common side effects after or during a performance of brain stimulation techniques.   2. This individual appears to be experiencing moderate symptoms of depression and mild anxiety.  As such, engaging in psychotherapy should  be considered to better manage mood symptomology. Therein the patient can work on identifying triggers for depression and anxiety as well as work towards developing healthy coping mechanisms and implementing lifestyle modifications that can help to support ongoing mental health.      3. Regular medical care is important for an individual with MS. Therefore, make sure to maintain regular appointments with all medical providers. In addition, schedule these appointments during the patient's best time of day.   4. The patient is encouraged to attend to lifestyle factors for brain health (e.g., regular physical exercise, good nutrition habits, regular participation in cognitively-stimulating activities, and general stress management techniques), which are likely to have benefits for both emotional adjustment and cognition.  In fact, in addition to promoting general good health, regular exercise incorporating aerobic activities (e.g., brisk walking, jogging, bicycling, etc.) has been demonstrated to be a very effective treatment for depression and stress, with similar efficacy rates to both antidepressant medication and psychotherapy. And for those with orthopedic issues, water aerobics may be particularly beneficial.   5. Activities that have therapeutic value and might be useful to keep you cognitively stimulated; https://www.barrowneuro.org/get-to-know-barrow/centers-programs/neurorehabilitation-center/neuro-rehab-apps-and-games/ 6. lists compiled by therapists at the [facility],  7. categorized by level of  difficulty 8. NOT a substitute for therapy.    9. Neuropsychological re-evaluation is recommended as needed to monitor treatment efficacy, to assist with the management of the patient (pharmacological therapy), to determine any clinical and functional significance of brain abnormality over time, as well as to document any potential improvement or decline in cognitive functioning. Lastly, any follow-up testing will help delineate the specific cognitive basis of any new functional complaints. If you wish to make a follow-up appointment, please contact our office at [phone].     Community resources are available for those diagnosed with multiple sclerosis. The National Multiple Sclerosis Society has a website that offers numerous tips and recommendations to aide affected persons. There website is as follows: TennisProfile.is.    Techniques for making living spaces safer:  10. Make the stovetop and oven inaccessible by removing or covering the knobs 11. Secure all medications in a safe place 12. Unplug appliances when not in use 13. Reduce tripping hazards such as moving any cords out of the way 14. Could consider marking the edges of steps with colored tape 15. Install night lights throughout the house   If you have any questions, please contact us at [phone].    This report is intended solely for the confidential review and use by the referring professional to assist in diagnostic and medical decision-making needs.  This report should not be released to a third party without proper consent. [NOTE: data can be made available to qualified professionals with permission from the patient or legal representative/caregiver]    ____________________________________ Lamount Cohen, PsyD,  Licensed Psychologist (Provisional) Clinical Neuropsychologist     TEST SCORES:  Note: This summary of test scores accompanies the interpretive report and should not be considered in  isolation without reference to the appropriate sections in the text. Descriptors are based on appropriate normative data and may be adjusted based on clinical judgment. The terms "impaired" and "within normal limits (WNL)" are used when a more specific level of functioning cannot be determined.    Validity Testing:     Descriptor       CVLT-III Forced Choice Recognition: --- --- Within Expectation  D-KEFS Color Word Effort Index: --- --- Within Expectation  RCFT Combination Effort Score:  Reliable Digit Span (RDS) --- --- Below Expectation       Intellectual Functioning:                 Standard Score Percentile    Test of Premorbid Functioning: 107 68 Average       Wechsler Adult Intelligence Scale (WAIS-IV) Short Form*:     Full Scale IQ  103 58 Average  Similarities  10 50 Average  Information  10 50 Average  Visual Puzzles  15 95 Superior  Digit Span 6 9 Below Average  Coding 11 63 Average  *From [employer] (2009)          Memory:             [place] Verbal Learning Test (CVLT-III), Standard Form: Raw Score (Scaled/Standard Score) Percentile    Total Trials 1-5 59/80 (113) 81 Above Average  List B 10/16 (17) 99 Exceptionally High  Short-Delay Free Recall 13/16 (13) 84 Above Average  Short-Delay Cued Recall 12/16 (10) 50 Average  Long Delay Free Recall 12/16 (11) 63 Average  Long Delay Cued Recall 12/16 (10) 50 Average  Recognition Hits 15/16 (10) 50 Average  False Positive Errors 0 (13) 84 Above Average         Rey-Osterrieth Complex Figure Test (RCFT): Raw Score (T Score) Percentile    Immediate Recall 13.5/36 (41) 18 Below Average  Delayed Recall 14.5/36 (43) 25 Average  Recognition Total Correct 22/24 (59) 82 Above Average  True Positives 12 >16 Within Normal Limits  False Positives 2 >16 Within Normal Limits         Attention/Executive Function:               Trail Making Test (TMT): Raw Score (T Score) Percentile    Part A 18 secs., 0 errors (62) 88  Above Average  Part B 46 secs., 0 errors (57) 75 Above Average           Standard/Scaled Score Percentile    WAIS-IV PSI: 100 50 Average     Symbol Search 9 37 Average     Coding  11 63 Average       WAIS-IV WMI: 83 13 Below Average     Arithmetic  8 25 Average     Digit Span: 6 9 Below Average     Forward 2 <1 Exceptionally Low     Backward 7 16 Below Average     Sequencing 10 50 Average           Scaled Score Percentile    WAIS-IV Similarities: 10 50 Average  WAIS-IV Matrix Reasoning  11 63 Average         D-KEFS Color-Word Interference Test: Raw Score (Scaled Score) Percentile    Color Naming 32 secs. (9) 37 Average  Word Reading 25 secs. (9) 37 Average  Inhibition 60 secs. (10) 50 Average  Total Errors 2 errors (9) 37 Average  Inhibition/Switching 72 secs. (10) 50 Average  Total Errors 1 errors (11) 63 Average         [place] Card Sorting Test: Raw Score Percentile    Categories (trials) 6 (76) >16 Average  Total Errors 10 54 Average  % Perseverative Errors 7 50 Average  % Non-Perseverative Errors 7 46 Average  Failure to Maintain Set 0 >16 Average       Language:               Verbal Fluency Test: Raw Score (T Score) Percentile  Phonemic Fluency (FAS) 37 (39) 14 Below Average  Animal Fluency 21 (47) 38 Average           Raw Score (T Score) Percentile    [place] Naming Test (BNT): 54/60 (37) 9 Below Average         Visuospatial/Visuoconstruction:          Raw Score Percentile    RCFT, Copy: 26.5/36 <1 Exceptionally Low    Scaled Score Percentile         WAIS-IV Visual Puzzles : 15 95 Superior       HVOT T=45 32 Average         Sensory-Motor:               [place] Grooved Pegboard Test: Raw Score Percentile    Dominant Hand (Lt.) >300 secs., 5 drops  <1 Exceptionally Low  Non-Dominant Hand 110 secs., 3 drops  3 Well Below Average         Hand Dynamometer  Tapping Test: Mean Kg Percentile    Dominant Hand 2.5 <1 Exceptionally Low  Non-Dominant Hand  11.5 16 Below Average

## 2020-04-26 ENCOUNTER — Ambulatory Visit: Payer: 59 | Admitting: Occupational Therapy

## 2020-04-26 LAB — CYTOLOGY - PAP
Comment: NEGATIVE
Diagnosis: NEGATIVE
High risk HPV: NEGATIVE

## 2020-04-28 ENCOUNTER — Ambulatory Visit (INDEPENDENT_AMBULATORY_CARE_PROVIDER_SITE_OTHER): Payer: 59

## 2020-04-28 ENCOUNTER — Other Ambulatory Visit: Payer: Self-pay

## 2020-04-28 ENCOUNTER — Encounter: Payer: 59 | Admitting: Occupational Therapy

## 2020-04-28 DIAGNOSIS — G35 Multiple sclerosis: Secondary | ICD-10-CM | POA: Diagnosis not present

## 2020-04-28 MED ORDER — GADOBENATE DIMEGLUMINE 529 MG/ML IV SOLN
20.0000 mL | Freq: Once | INTRAVENOUS | Status: AC | PRN
  Administered 2020-04-28: 20 mL via INTRAVENOUS

## 2020-04-29 LAB — SURESWAB, VAGINOSIS/VAGINITIS PLUS
Atopobium vaginae: NOT DETECTED Log (cells/mL)
BV CATEGORY: UNDETERMINED — AB
C. albicans, DNA: NOT DETECTED
C. glabrata, DNA: NOT DETECTED
C. parapsilosis, DNA: NOT DETECTED
C. trachomatis RNA, TMA: NOT DETECTED
C. tropicalis, DNA: NOT DETECTED
Gardnerella vaginalis: 7.6 Log (cells/mL)
LACTOBACILLUS SPECIES: 6.5 Log (cells/mL)
MEGASPHAERA SPECIES: NOT DETECTED Log (cells/mL)
N. gonorrhoeae RNA, TMA: NOT DETECTED
Trichomonas vaginalis RNA: NOT DETECTED

## 2020-04-29 MED ORDER — METRONIDAZOLE 500 MG PO TABS: 500.0000 mg | ORAL_TABLET | Freq: Two times a day (BID) | ORAL | 0 refills | Status: DC

## 2020-04-29 NOTE — Addendum Note (Signed)
Addended by: Deirdre Pippins on: 04/29/2020 06:09 PM   Modules accepted: Orders

## 2020-04-30 MED ORDER — METRONIDAZOLE 500 MG PO TABS: 500.0000 mg | ORAL_TABLET | Freq: Two times a day (BID) | ORAL | 0 refills | Status: DC

## 2020-04-30 MED ORDER — METRONIDAZOLE 500 MG PO TABS: 500.0000 mg | ORAL_TABLET | Freq: Two times a day (BID) | ORAL | 0 refills | Status: AC

## 2020-04-30 NOTE — Addendum Note (Signed)
Addended by: Chalmers Cater on: 04/30/2020 09:37 AM   Modules accepted: Orders

## 2020-04-30 NOTE — Addendum Note (Signed)
Addended by: Chalmers Cater on: 04/30/2020 07:33 AM   Modules accepted: Orders

## 2020-05-04 ENCOUNTER — Ambulatory Visit: Payer: 59 | Admitting: Osteopathic Medicine

## 2020-05-10 ENCOUNTER — Encounter: Payer: 59 | Admitting: Occupational Therapy

## 2020-05-12 ENCOUNTER — Encounter: Payer: 59 | Admitting: Occupational Therapy

## 2020-05-13 ENCOUNTER — Ambulatory Visit: Payer: 59 | Admitting: Occupational Therapy

## 2020-05-18 ENCOUNTER — Other Ambulatory Visit: Payer: Self-pay

## 2020-05-18 ENCOUNTER — Ambulatory Visit: Payer: 59 | Attending: Neurology | Admitting: Occupational Therapy

## 2020-05-18 DIAGNOSIS — M6281 Muscle weakness (generalized): Secondary | ICD-10-CM | POA: Diagnosis present

## 2020-05-18 DIAGNOSIS — R278 Other lack of coordination: Secondary | ICD-10-CM | POA: Insufficient documentation

## 2020-05-18 DIAGNOSIS — G8112 Spastic hemiplegia affecting left dominant side: Secondary | ICD-10-CM | POA: Diagnosis not present

## 2020-05-18 NOTE — Therapy (Signed)
[facility] [address] [place], [place], [address] Phone: [phone]   Fax:  [phone]  Occupational Therapy Treatment  Patient Details  Name: Meredith Hale MRN: [id number] Date of Birth: August 22, 1960 Referring Provider (OT): Dr. Felecia Shelling   Encounter Date: 05/18/2020   OT End of Session - 05/18/20 1027    Visit Number 2    Number of Visits 5    Date for OT Re-Evaluation 08/15/20    Authorization Type [place], VL: 30 PT/OT/ST combined, no auth required    OT Start Time 0930    OT Stop Time 1015    OT Time Calculation (min) 45 min    Activity Tolerance Patient tolerated treatment well    Behavior During Therapy [facility] for tasks assessed/performed           Past Medical History:  Diagnosis Date  . Multiple sclerosis ([place])   . Psoriasis   . Scoliosis     Past Surgical History:  Procedure Laterality Date  . AUGMENTATION MAMMAPLASTY    . bone navicular  07/05/2006  . FINGER GANGLION CYST EXCISION    . FINGER SURGERY  06/28/2015   capsular release due to injury  . FOOT SURGERY     right and left foot  . FOOT SURGERY    . HEMORRHOIDECTOMY WITH HEMORRHOID BANDING  04/19/2017  . LUMBAR EPIDURAL INJECTION  04/27/2017  . lymph node removal  1976  . REFRACTIVE SURGERY  06/02/1994  . 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   Subjective Assessment - 05/18/20 0936    Subjective  Little fatigue today    Pertinent History Relapsing remitting MS diagnosed in 2006, scoliosis    Currently in Pain? No/denies    Pain Onset More than a month ago          Pt practiced writing with built up pen and Pen Again. Pt did better with built up pen. Pt provided with tan and red foam. Issued handouts on multiple A/E recommendations including all from LTG #2 and writing options, other shoe options, and scissors. Pt shown rocker knife,  one handed cutting board, and shoe buttons and demo use of each. Pt return demo of rocker knife but did not have time to return demo of shoe buttons d/t time constraints. Pt also seemed interested in velcro converters.                       OT Education - 05/18/20 1027    Education Details Reviewed energy conservation techniques (already given to her by another facility), MS Society info and support group info, A/E recommendations (multiple handouts provided)    Person(s) Educated Patient    Methods Explanation;Handout;Demonstration   demo use of rocker knife and shoe buttons   Comprehension Verbalized understanding;Returned demonstration   of rocker knife              OT Long Term Goals - 05/18/20 1027      OT LONG TERM GOAL #1   Title Pt will be independent with splint wear and care for Lt hand (resting hand for pm, possible neoprene thumb splint for daytime)    Time 4    Period Weeks    Status New      OT LONG TERM GOAL #2   Title Pt will verbalize understanding of any A/E needs  and task modifications for greater ease and safety with ADLS (Specifically for shoes, buttons, hairdryer holder, one handed cutting board, rocker knife, pot stablizier)    Time 4    Period Weeks    Status Achieved      OT LONG TERM GOAL #3   Title Pt will verbalize understanding with energy conservation techniques and MS support groups    Time 4    Period Weeks    Status Achieved                 Plan - 05/18/20 1029    Clinical Impression Statement Pt has already met 2/3 LTG's. Pt progressing with task modifications    OT Occupational Profile and History Problem Focused Assessment - Including review of records relating to presenting problem    Occupational performance deficits (Please refer to evaluation for details): ADL's;IADL's;Work    Body Structure / Function / Physical Skills ADL;UE functional use;Pain;ROM;Coordination;Sensation;IADL;Mobility;Strength;Dexterity;Body  mechanics;Balance   endurance   Rehab Potential Good   for goals set   Clinical Decision Making Several treatment options, min-mod task modification necessary    Comorbidities Affecting Occupational Performance: Presence of comorbidities impacting occupational performance    Comorbidities impacting occupational performance description: MS    Modification or Assistance to Complete Evaluation  Min-Moderate modification of tasks or assist with assess necessary to complete eval    OT Frequency 2x / week    OT Duration 4 weeks   anticipate only 2 weeks needed for goals set   OT Treatment/Interventions Self-care/ADL training;Energy conservation;DME and/or AE instruction;Patient/family education;Passive range of motion;Functional Mobility Training;Coping strategies training;Therapeutic activities;Manual Therapy;Therapeutic exercise;Neuromuscular education    Plan Address LTG #1    Consulted and Agree with Plan of Care Patient           Patient will benefit from skilled therapeutic intervention in order to improve the following deficits and impairments:   Body Structure / Function / Physical Skills: ADL, UE functional use, Pain, ROM, Coordination, Sensation, IADL, Mobility, Strength, Dexterity, Body mechanics, Balance (endurance)       Visit Diagnosis: Spastic hemiplegia affecting left dominant side, unspecified etiology (HCC)  Other lack of coordination  Muscle weakness (generalized)    Problem List Patient Active Problem List   Diagnosis Date Noted  . Alteration in cognition 04/21/2020  . Left hand weakness 04/21/2020  . Psoriasis 11/21/2019  . Abnormal gait 05/19/2019  . 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 Multiple sclerosis ([place]) 01/21/2019  . Left spastic hemiplegia ([place]) 01/21/2019  . Other fatigue 01/21/2019  . Urinary hesitancy 01/21/2019  . Vitamin D deficiency 01/21/2019  . Normal gynecologic examination 08/16/2018     Carey Bullocks, OTR/L 05/18/2020, 10:31 AM  [facility] [address] [place], [place], [address] Phone: [phone]   Fax:  [phone]  Name: Teshara Moree MRN: [id number] Date of Birth: April 11, 1961

## 2020-05-20 ENCOUNTER — Ambulatory Visit: Payer: 59 | Admitting: Occupational Therapy

## 2020-05-20 ENCOUNTER — Other Ambulatory Visit: Payer: Self-pay

## 2020-05-20 DIAGNOSIS — G8112 Spastic hemiplegia affecting left dominant side: Secondary | ICD-10-CM | POA: Diagnosis not present

## 2020-05-20 NOTE — Therapy (Signed)
[facility] [address] [place], [place], [address] Phone: [phone]   Fax:  [phone]  Occupational Therapy Treatment  Patient Details  Name: Meredith Hale MRN: [id number] Date of Birth: 1961-07-11 Referring Provider (OT): Dr. Epimenio Foot   Encounter Date: 05/20/2020   OT End of Session - 05/20/20 1535    Visit Number 3    Number of Visits 5    Date for OT Re-Evaluation 08/15/20    Authorization Type [facility], VL: 30 PT/OT/ST combined, no auth required    OT Start Time 1440    OT Stop Time 1530    OT Time Calculation (min) 50 min    Activity Tolerance Patient tolerated treatment well    Behavior During Therapy [facility] for tasks assessed/performed           Past Medical History:  Diagnosis Date   Multiple sclerosis (HCC)    Psoriasis    Scoliosis     Past Surgical History:  Procedure Laterality Date   AUGMENTATION MAMMAPLASTY     bone navicular  07/05/2006   FINGER GANGLION CYST EXCISION     FINGER SURGERY  06/28/2015   capsular release due to injury   FOOT SURGERY     right and left foot   FOOT SURGERY     HEMORRHOIDECTOMY WITH HEMORRHOID BANDING  04/19/2017   LUMBAR EPIDURAL INJECTION  04/27/2017   lymph node removal  1976   REFRACTIVE SURGERY  06/02/1994   THYROID SURGERY     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   Subjective Assessment - 05/20/20 1444    Subjective  Fatigue is a little high since we celebrated my birthday today    Pertinent History Relapsing remitting MS diagnosed in 2006, scoliosis    Currently in Pain? Yes    Pain Score 3     Pain Location Back    Pain Orientation Lower    Pain Descriptors / Indicators Aching    Pain Type Chronic pain    Pain Onset More than a month ago    Pain Frequency Intermittent    Aggravating Factors  cold and/or rainy weather    Pain Relieving Factors pain  meds           Pt issued [facility] neoprene splint to wear during the day to help support Lt thumb - pt instructed in wear and care and how she may need to take off for certain activities or if it decreases function.  Fabricated and fitted resting hand splint for pm wear however instructed to gradually build up tolerance over the next few days while awake before switching to night time. Issued both.                      OT Education - 05/20/20 1526    Education Details splint wear and care for pm and daytime splint    Person(s) Educated Patient    Methods Explanation;Handout;Demonstration    Comprehension Verbalized understanding;Returned demonstration               OT Long Term Goals - 05/20/20 1536      OT LONG TERM GOAL #1   Title Pt will be independent with splint wear and care for Lt hand (resting hand for pm, possible neoprene thumb splint for daytime)    Time 4    Period Weeks    Status  On-going      OT LONG TERM GOAL #2   Title Pt will verbalize understanding of any A/E needs and task modifications for greater ease and safety with ADLS (Specifically for shoes, buttons, hairdryer holder, one handed cutting board, rocker knife, pot stablizier)    Time 4    Period Weeks    Status Achieved      OT LONG TERM GOAL #3   Title Pt will verbalize understanding with energy conservation techniques and MS support groups    Time 4    Period Weeks    Status Achieved                 Plan - 05/20/20 1536    Clinical Impression Statement Pt progressing towards LTG #1 at this time - will monitor her tolerance to splints    OT Occupational Profile and History Problem Focused Assessment - Including review of records relating to presenting problem    Occupational performance deficits (Please refer to evaluation for details): ADL's;IADL's;Work    Body Structure / Function / Physical Skills ADL;UE functional  use;Pain;ROM;Coordination;Sensation;IADL;Mobility;Strength;Dexterity;Body mechanics;Balance   endurance   Rehab Potential Good   for goals set   Clinical Decision Making Several treatment options, min-mod task modification necessary    Comorbidities Affecting Occupational Performance: Presence of comorbidities impacting occupational performance    Comorbidities impacting occupational performance description: MS    Modification or Assistance to Complete Evaluation  Min-Moderate modification of tasks or assist with assess necessary to complete eval    OT Frequency 2x / week    OT Duration 4 weeks   anticipate only 2 weeks needed for goals set   OT Treatment/Interventions Self-care/ADL training;Energy conservation;DME and/or AE instruction;Patient/family education;Passive range of motion;Functional Mobility Training;Coping strategies training;Therapeutic activities;Manual Therapy;Therapeutic exercise;Neuromuscular education    Plan assess splint and adjust prn, show adapted scissors, anticipate d/c if no further problems    Consulted and Agree with Plan of Care Patient           Patient will benefit from skilled therapeutic intervention in order to improve the following deficits and impairments:   Body Structure / Function / Physical Skills: ADL, UE functional use, Pain, ROM, Coordination, Sensation, IADL, Mobility, Strength, Dexterity, Body mechanics, Balance (endurance)       Visit Diagnosis: Spastic hemiplegia affecting left dominant side, unspecified etiology (HCC)    Problem List Patient Active Problem List   Diagnosis Date Noted   Alteration in cognition 04/21/2020   Left hand weakness 04/21/2020   Psoriasis 11/21/2019   Abnormal gait 05/19/2019   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 [place], [place], [address] Phone: [phone]   Fax:  [phone]  Name: Kaitland Lewellyn MRN: [id number] Date of Birth: [birth year]

## 2020-05-20 NOTE — Patient Instructions (Signed)
Your Splint This splint should initially be fitted by a healthcare practitioner.  The healthcare practitioner is responsible for providing wearing instructions and precautions to the patient, other healthcare practitioners and care provider involved in the patient's care.  This splint was custom made for you. Please read the following instructions to learn about wearing and caring for your splint.  Precautions Should your splint cause any of the following problems, remove the splint immediately and contact your therapist/physician.  Swelling  Severe Pain  Pressure Areas  Stiffness  Numbness  Do not wear your splint while operating machinery unless it has been fabricated for that purpose.  When To Wear Your Splint Where your splint according to your therapist/physician instructions. Daytime for 30 minutes to an 1 hour, gradually increase an hour each day until you can tolerate 5 consecutive hours, then switch to night time  Care and Cleaning of Your Splint 1. Keep your splint away from open flames. 2. Your splint will lose its shape in temperatures over 135 degrees Farenheit, ( in car windows, near radiators, ovens or in hot water).  Never make any adjustments to your splint, if the splint needs adjusting remove it and make an appointment to see your therapist. 3. Your splint may be cleaned with rubbing alcohol wipes.  Do not immerse in hot water over 135 degrees Farenheit.

## 2020-05-25 ENCOUNTER — Other Ambulatory Visit: Payer: Self-pay | Admitting: *Deleted

## 2020-05-25 DIAGNOSIS — R269 Unspecified abnormalities of gait and mobility: Secondary | ICD-10-CM

## 2020-05-25 DIAGNOSIS — G35 Multiple sclerosis: Secondary | ICD-10-CM

## 2020-05-25 DIAGNOSIS — G8114 Spastic hemiplegia affecting left nondominant side: Secondary | ICD-10-CM

## 2020-05-26 ENCOUNTER — Ambulatory Visit: Payer: 59 | Admitting: Occupational Therapy

## 2020-05-26 ENCOUNTER — Other Ambulatory Visit: Payer: Self-pay

## 2020-05-26 DIAGNOSIS — M6281 Muscle weakness (generalized): Secondary | ICD-10-CM

## 2020-05-26 DIAGNOSIS — G8112 Spastic hemiplegia affecting left dominant side: Secondary | ICD-10-CM

## 2020-05-26 DIAGNOSIS — R278 Other lack of coordination: Secondary | ICD-10-CM

## 2020-05-26 NOTE — Therapy (Signed)
[facility] [address] [place], [place], [address] Phone: [phone]   Fax:  [phone]  Occupational Therapy Treatment  Patient Details  Name: Meredith Hale MRN: [id number] Date of Birth: Sep 14, 1960 Referring Provider (OT): Dr. Felecia Shelling   Encounter Date: 05/26/2020   OT End of Session - 05/26/20 1107    Visit Number 4    Number of Visits 5    Date for OT Re-Evaluation 08/15/20    Authorization Type [facility], VL: 30 PT/OT/ST combined, no auth required    OT Start Time 1020    OT Stop Time 1100    OT Time Calculation (min) 40 min    Activity Tolerance Patient tolerated treatment well    Behavior During Therapy [facility] for tasks assessed/performed           Past Medical History:  Diagnosis Date  . Multiple sclerosis ([place])   . Psoriasis   . Scoliosis     Past Surgical History:  Procedure Laterality Date  . AUGMENTATION MAMMAPLASTY    . bone navicular  07/05/2006  . FINGER GANGLION CYST EXCISION    . FINGER SURGERY  06/28/2015   capsular release due to injury  . FOOT SURGERY     right and left foot  . FOOT SURGERY    . HEMORRHOIDECTOMY WITH HEMORRHOID BANDING  04/19/2017  . LUMBAR EPIDURAL INJECTION  04/27/2017  . lymph node removal  1976  . REFRACTIVE SURGERY  06/02/1994  . 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   Subjective Assessment - 05/26/20 1025    Subjective  The splint is doing well - wearing for about 1/2 the night until I have to get up and go to the bathroom    Pertinent History Relapsing remitting MS diagnosed in 2006, scoliosis    Currently in Pain? Yes    Pain Score 4     Pain Location Back    Pain Orientation Lower    Pain Descriptors / Indicators Aching    Pain Type Chronic pain    Pain Onset More than a month ago    Pain Frequency Constant    Aggravating Factors  cold and rainy weather     Pain Relieving Factors pain meds          Pt reports resting hand splint is fitting well and no signs of irritation or skin breakdown. Pt is wearing for approx 1/2 the night until she has to get up to use restroom. Pt encouraged to now wear neoprene splint during the day for activities that are helpful, but can take off if it limits function.  Discussed adaptive devices for jewelry (earring backings) as well as adaptive equipment for writing - recommended gel pens since pt reports decreased ability to place sufficient pressure on pen for writing.  Also shown Up style walker and pt practiced ambulating with this.  Reviewed goals and discussed d/c - pt in agreement.                            OT Long Term Goals - 05/26/20 1107      OT LONG TERM GOAL #1   Title Pt will be independent with splint wear and care for Lt hand (resting hand for pm, possible neoprene thumb splint for daytime)    Time 4  Period Weeks    Status Achieved      OT LONG TERM GOAL #2   Title Pt will verbalize understanding of any A/E needs and task modifications for greater ease and safety with ADLS (Specifically for shoes, buttons, hairdryer holder, one handed cutting board, rocker knife, pot stablizier)    Time 4    Period Weeks    Status Achieved      OT LONG TERM GOAL #3   Title Pt will verbalize understanding with energy conservation techniques and MS support groups    Time 4    Period Weeks    Status Achieved                 Plan - 05/26/20 1107    Clinical Impression Statement Pt has met all LTG's at this time.    OT Occupational Profile and History Problem Focused Assessment - Including review of records relating to presenting problem    Occupational performance deficits (Please refer to evaluation for details): ADL's;IADL's;Work    Body Structure / Function / Physical Skills ADL;UE functional use;Pain;ROM;Coordination;Sensation;IADL;Mobility;Strength;Dexterity;Body  mechanics;Balance   endurance   Rehab Potential Good   for goals set   Clinical Decision Making Several treatment options, min-mod task modification necessary    Comorbidities Affecting Occupational Performance: Presence of comorbidities impacting occupational performance    Comorbidities impacting occupational performance description: MS    Modification or Assistance to Complete Evaluation  Min-Moderate modification of tasks or assist with assess necessary to complete eval    OT Frequency 2x / week    OT Duration 4 weeks   anticipate only 2 weeks needed for goals set   OT Treatment/Interventions Self-care/ADL training;Energy conservation;DME and/or AE instruction;Patient/family education;Passive range of motion;Functional Mobility Training;Coping strategies training;Therapeutic activities;Manual Therapy;Therapeutic exercise;Neuromuscular education    Plan D/C O.T.    Consulted and Agree with Plan of Care Patient           Patient will benefit from skilled therapeutic intervention in order to improve the following deficits and impairments:   Body Structure / Function / Physical Skills: ADL, UE functional use, Pain, ROM, Coordination, Sensation, IADL, Mobility, Strength, Dexterity, Body mechanics, Balance (endurance)       Visit Diagnosis: Spastic hemiplegia affecting left dominant side, unspecified etiology (HCC)  Other lack of coordination  Muscle weakness (generalized)    Problem List Patient Active Problem List   Diagnosis Date Noted  . Alteration in cognition 04/21/2020  . Left hand weakness 04/21/2020  . Psoriasis 11/21/2019  . Abnormal gait 05/19/2019  . Chiari malformation type I ([place]) 04/01/2019  . 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 Multiple sclerosis ([place]) 01/21/2019  . Left spastic hemiplegia ([place]) 01/21/2019  . Other fatigue 01/21/2019  . Urinary hesitancy 01/21/2019  . Vitamin D deficiency 01/21/2019  . Normal gynecologic examination 08/16/2018   OCCUPATIONAL  THERAPY DISCHARGE SUMMARY  Visits from Start of Care: 4  Current functional level related to goals / functional outcomes: See above   Remaining deficits: Same as initial evaluation   Education / Equipment: Splint wear and care, A/E recommendations, local support groups for MS  Plan: Patient agrees to discharge.  Patient goals were met. Patient is being discharged due to meeting the stated rehab goals.  ?????       Carey Bullocks, OTR/L 05/26/2020, 11:08 AM  [facility] [address] [place], [place], [address] Phone: [phone]   Fax:  [phone]  Name: Meredith Hale MRN: [id number] Date of  Birth: 15-Oct-1960

## 2020-05-31 ENCOUNTER — Ambulatory Visit: Payer: 59 | Admitting: Occupational Therapy

## 2020-07-02 ENCOUNTER — Other Ambulatory Visit: Payer: Self-pay | Admitting: *Deleted

## 2020-07-02 MED ORDER — SOLIFENACIN SUCCINATE 5 MG PO TABS
5.0000 mg | ORAL_TABLET | Freq: Every day | ORAL | 3 refills | Status: DC
Start: 2020-07-02 — End: 2021-03-29

## 2020-07-05 ENCOUNTER — Ambulatory Visit: Payer: 59 | Admitting: Neurology

## 2020-07-28 ENCOUNTER — Ambulatory Visit: Payer: Self-pay | Admitting: Neurology

## 2020-08-03 ENCOUNTER — Ambulatory Visit: Payer: Self-pay | Admitting: Family Medicine

## 2020-08-04 ENCOUNTER — Ambulatory Visit: Payer: 59 | Admitting: Podiatry

## 2020-08-11 ENCOUNTER — Other Ambulatory Visit: Payer: Self-pay

## 2020-08-11 ENCOUNTER — Emergency Department
Admission: EM | Admit: 2020-08-11 | Discharge: 2020-08-11 | Disposition: A | Discharge status: Home / Self Care | Payer: 59 | Source: Home / Self Care

## 2020-08-11 ENCOUNTER — Other Ambulatory Visit (HOSPITAL_COMMUNITY)
Admission: RE | Admit: 2020-08-11 | Discharge: 2020-08-11 | Disposition: A | Discharge status: Home / Self Care | Payer: 59 | Source: Ambulatory Visit | Attending: Family Medicine | Admitting: Family Medicine

## 2020-08-11 DIAGNOSIS — N898 Other specified noninflammatory disorders of vagina: Secondary | ICD-10-CM | POA: Diagnosis not present

## 2020-08-11 DIAGNOSIS — R300 Dysuria: Secondary | ICD-10-CM | POA: Diagnosis present

## 2020-08-11 DIAGNOSIS — N760 Acute vaginitis: Secondary | ICD-10-CM | POA: Insufficient documentation

## 2020-08-11 LAB — POCT URINALYSIS DIP (MANUAL ENTRY)
Blood, UA: NEGATIVE
Glucose, UA: NEGATIVE mg/dL
Leukocytes, UA: NEGATIVE
Nitrite, UA: NEGATIVE
Protein Ur, POC: NEGATIVE mg/dL
Spec Grav, UA: 1.02 (ref 1.010–1.025)
Urobilinogen, UA: 0.2 E.U./dL
pH, UA: 6 (ref 5.0–8.0)

## 2020-08-11 MED ORDER — FLUCONAZOLE 200 MG PO TABS: 200.0000 mg | ORAL_TABLET | Freq: Once | ORAL | 0 refills | Status: AC

## 2020-08-11 MED ORDER — FLUCONAZOLE 200 MG PO TABS: 200.0000 mg | ORAL_TABLET | Freq: Once | ORAL | 0 refills | Status: DC

## 2020-08-11 NOTE — ED Provider Notes (Signed)
[facility]    CSN: [id number] Arrival date & time: 08/11/20  1558      History   Chief Complaint Chief Complaint  Patient presents with  . Dysuria    HPI Meredith Hale is a 60 y.o. female.   HPI  Meredith Hale is a 60 y.o. female presenting to UC with c/o burning with urination and foul-smell with urination for about 1 week. Pt takes medication for MS that makes her prone to UTIs. Hx of malodorous vaginal discharge a few months ago, tx for BV. Pt reports having a "brownish" discharge and feels different than when she had BV. Denies concern for STIs. Denies fever, chills, n/v/d. No abdominal pain or back pain.  Past Medical History:  Diagnosis Date  . Multiple sclerosis (HCC)   . Psoriasis   . Scoliosis     Patient Active Problem List   Diagnosis Date Noted  . Alteration in cognition 04/21/2020  . Left hand weakness 04/21/2020  . Psoriasis 11/21/2019  . Abnormal gait 05/19/2019  . 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 Multiple sclerosis (HCC) 01/21/2019  . Left spastic hemiplegia (HCC) 01/21/2019  . Other fatigue 01/21/2019  . Urinary hesitancy 01/21/2019  . Vitamin D deficiency 01/21/2019  . Normal gynecologic examination 08/16/2018    Past Surgical History:  Procedure Laterality Date  . AUGMENTATION MAMMAPLASTY    . bone navicular  07/05/2006  . FINGER GANGLION CYST EXCISION    . FINGER SURGERY  06/28/2015   capsular release due to injury  . FOOT SURGERY     right and left foot  . FOOT SURGERY    . HEMORRHOIDECTOMY WITH HEMORRHOID BANDING  04/19/2017  . LUMBAR EPIDURAL INJECTION  04/27/2017  . lymph node removal  1976  . REFRACTIVE SURGERY  06/02/1994  . 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      Home Medications    Prior to Admission medications   Medication Sig Start Date End Date Taking?  Authorizing Provider  AMBULATORY NON FORMULARY MEDICATION [facility] Attn Douglas Leclair Right knee brace for genu recurvatum to assist stability and ambulation. Left ankle-foot orthosis for foot drop to assess stability and ambulation. Duration of need = lifelong. 06/02/19   Monica Becton, MD  baclofen (LIORESAL) 20 MG tablet Take 1 tablet (20 mg total) by mouth 4 (four) times daily. 04/21/20   Sater, Pearletha Furl, MD  Biotin 1000 MCG tablet Take 1,000 mcg by mouth daily.     [provider]  buPROPion (WELLBUTRIN XL) 300 MG 24 hr tablet Take 1 tablet (300 mg total) by mouth daily. 01/09/20   Sater, Pearletha Furl, MD  CALCIUM-VITAMIN D PO Take by mouth.    [provider]  Cholecalciferol (D3-1000) 25 MCG (1000 UT) capsule Take 1,000 Units by mouth daily.    [provider]  Clobetasol Propionate (CLOBEX SPRAY) 0.05 % external spray Clobex 0.05 % topical spray    [provider]  CLODERM 0.1 % cream APPLY TOPICALLY TO THE AFFECTED AREA EVERY DAY 05/05/19   [provider]  fluocinonide (LIDEX) 0.05 % external solution Apply 1 application topically as needed.    [provider]  Misc. Devices (WALKER) MISC Upright walker for MS and gait disturbance.    G35 and R26.9 04/21/20   Despina Arias  A, MD  modafinil (PROVIGIL) 200 MG tablet Take 1 tablet (200 mg total) by mouth daily. 03/25/20   Sater, Pearletha Furl, MD  Multiple Vitamins-Minerals (MULTIVITAL) tablet Take 1 tablet by mouth daily.    [provider]  naproxen (NAPROSYN) 500 MG tablet Take 1 tablet (500 mg total) by mouth 2 (two) times daily as needed. 04/21/20   Sater, Pearletha Furl, MD  ocrelizumab 600 mg in sodium chloride 0.9 % 500 mL Inject 600 mg into the vein every 6 (six) months.    [provider]  Omega-3 Fatty Acids (FISH OIL) 1000 MG CAPS Take by mouth.    [provider]  omeprazole (PRILOSEC) 40 MG capsule Take 1 capsule (40 mg  total) by mouth as needed. 04/21/20   Sater, Pearletha Furl, MD  Probiotic Product (ALIGN PO) Take by mouth.    [provider]  solifenacin (VESICARE) 5 MG tablet Take 1 tablet (5 mg total) by mouth daily. 07/02/20   Sater, Pearletha Furl, MD  tamsulosin (FLOMAX) 0.4 MG CAPS capsule Take 1 capsule (0.4 mg total) by mouth daily. 04/21/20   Sater, Pearletha Furl, MD  tiZANidine (ZANAFLEX) 4 MG tablet Take 1 to 2 capsules nightly 04/21/20   Sater, Pearletha Furl, MD    Family History Family History  Adopted: Yes  Family history unknown: Yes    Social History Social History   Tobacco Use  . Smoking status: Former Smoker    Packs/day: 1.00  . Smokeless tobacco: Never Used  Vaping Use  . Vaping Use: Never used  Substance Use Topics  . Alcohol use: Yes    Comment: 1 per week  . Drug use: Never     Allergies   Colophony [pinus strobus], Formaldehyde, Propylene glycol, and Quaternium-15   Review of Systems Review of Systems  Constitutional: Negative for chills and fever.  HENT: Negative for congestion, ear pain, sore throat, trouble swallowing and voice change.   Respiratory: Negative for cough and shortness of breath.   Cardiovascular: Negative for chest pain and palpitations.  Gastrointestinal: Negative for abdominal pain, diarrhea, nausea and vomiting.  Genitourinary: Positive for dysuria and vaginal discharge. Negative for decreased urine volume, frequency, genital sores, pelvic pain, urgency, vaginal bleeding and vaginal pain.  Musculoskeletal: Negative for arthralgias, back pain and myalgias.  Skin: Negative for rash.  All other systems reviewed and are negative.    Physical Exam Triage Vital Signs ED Triage Vitals  Enc Vitals Group     BP 08/11/20 1649 (!) 156/80     Pulse Rate 08/11/20 1649 77     Resp 08/11/20 1649 16     Temp 08/11/20 1649 98.3 F (36.8 C)     Temp Source 08/11/20 1649 Oral     SpO2 08/11/20 1649 99 %     Weight --      Height --      Head  Circumference --      Peak Flow --      Pain Score 08/11/20 1648 0     Pain Loc --      Pain Edu? --      Excl. in GC? --    No data found.  Updated Vital Signs BP (!) 156/80 (BP Location: Right Arm)   Pulse 77   Temp 98.3 F (36.8 C) (Oral)   Resp 16   SpO2 99%   Visual Acuity Right Eye Distance:   Left Eye Distance:   Bilateral Distance:    Right Eye Near:  Left Eye Near:    Bilateral Near:     Physical Exam Vitals and nursing note reviewed.  Constitutional:      General: She is not in acute distress.    Appearance: Normal appearance. She is well-developed and well-nourished. She is not ill-appearing, toxic-appearing or diaphoretic.  HENT:     Head: Normocephalic and atraumatic.  Eyes:     Extraocular Movements: EOM normal.  Cardiovascular:     Rate and Rhythm: Normal rate and regular rhythm.  Pulmonary:     Effort: Pulmonary effort is normal. No respiratory distress.     Breath sounds: Normal breath sounds. No stridor. No wheezing, rhonchi or rales.  Abdominal:     General: There is no distension.     Palpations: Abdomen is soft.     Tenderness: There is no abdominal tenderness. There is no right CVA tenderness or left CVA tenderness.  Genitourinary:    Comments: Deferred, pt declined pelvic exam. Performed vaginal self swab. Musculoskeletal:        General: Normal range of motion.     Cervical back: Normal range of motion.  Skin:    General: Skin is warm and dry.  Neurological:     Mental Status: She is alert and oriented to person, place, and time.  Psychiatric:        Mood and Affect: Mood and affect normal.        Behavior: Behavior normal.      UC Treatments / Results  Labs (all labs ordered are listed, but only abnormal results are displayed) Labs Reviewed  POCT URINALYSIS DIP (MANUAL ENTRY) - Abnormal; Notable for the following components:      Result Value   Bilirubin, UA small (*)    Ketones, POC UA small (15) (*)    All other  components within normal limits  CERVICOVAGINAL ANCILLARY ONLY    EKG   Radiology No results found.  Procedures Procedures (including critical care time)  Medications Ordered in UC Medications - No data to display  Initial Impression / Assessment and Plan / UC Course  I have reviewed the triage vital signs and the nursing notes.  Pertinent labs & imaging results that were available during my care of the patient were reviewed by me and considered in my medical decision making (see chart for details).     UA not c/w UTI Vaginal self swab sent to lab for testing for BV and yeast Will start empirically on diflucan for yeast due to reports of vaginal burning/irritation F/u with PCP or GYN next week if not improving.  Final Clinical Impressions(s) / UC Diagnoses   Final diagnoses:  Vaginal discharge   Discharge Instructions   None    ED Prescriptions    Medication Sig Dispense Auth. Provider   fluconazole (DIFLUCAN) 200 MG tablet  (Status: Discontinued) Take 1 tablet (200 mg total) by mouth once for 1 dose. Repeat in 3 days if still symptomatic 2 tablet Doroteo Glassman, Sunita Demond O, PA-C   fluconazole (DIFLUCAN) 200 MG tablet Take 1 tablet (200 mg total) by mouth once for 1 dose. Repeat in 3 days if still symptomatic 2 tablet Lurene Shadow, PA-C     PDMP not reviewed this encounter.   Lurene Shadow, PA-C 08/12/20 1057

## 2020-08-11 NOTE — ED Triage Notes (Signed)
Patient presents to Urgent Care with complaints of burning with urination and foul-smelling urination since about a week ago. Patient reports she is on medication for MS that makes her prone to UTIs.

## 2020-08-13 LAB — CERVICOVAGINAL ANCILLARY ONLY
Bacterial Vaginitis (gardnerella): NEGATIVE
Candida Glabrata: NEGATIVE
Candida Vaginitis: NEGATIVE
Comment: NEGATIVE
Comment: NEGATIVE
Comment: NEGATIVE

## 2020-08-30 ENCOUNTER — Telehealth: Payer: Self-pay | Admitting: Neurology

## 2020-08-30 ENCOUNTER — Other Ambulatory Visit: Payer: Self-pay | Admitting: *Deleted

## 2020-08-30 MED ORDER — BACLOFEN 20 MG PO TABS: 20.0000 mg | ORAL_TABLET | Freq: Four times a day (QID) | ORAL | 3 refills | Status: DC

## 2020-08-30 NOTE — Telephone Encounter (Signed)
Sent refill as requested.

## 2020-08-30 NOTE — Telephone Encounter (Signed)
[email or web address] is calling for  refill request for baclofen (LIORESAL) 20 MG tablet to be sent to them @ fax#[phone]

## 2020-09-20 ENCOUNTER — Other Ambulatory Visit: Payer: Self-pay | Admitting: Osteopathic Medicine

## 2020-09-20 ENCOUNTER — Other Ambulatory Visit: Payer: Self-pay | Admitting: Neurology

## 2020-09-20 DIAGNOSIS — G35 Multiple sclerosis: Secondary | ICD-10-CM

## 2020-09-20 DIAGNOSIS — Z1231 Encounter for screening mammogram for malignant neoplasm of breast: Secondary | ICD-10-CM

## 2020-09-20 DIAGNOSIS — D84821 Immunodeficiency due to drugs: Secondary | ICD-10-CM

## 2020-09-20 DIAGNOSIS — Z79899 Other long term (current) drug therapy: Secondary | ICD-10-CM

## 2020-10-04 ENCOUNTER — Other Ambulatory Visit: Payer: Self-pay | Admitting: Adult Health

## 2020-10-04 ENCOUNTER — Encounter: Payer: Self-pay | Admitting: Adult Health

## 2020-10-04 DIAGNOSIS — G35 Multiple sclerosis: Secondary | ICD-10-CM

## 2020-10-04 NOTE — Progress Notes (Signed)
I connected by phone with Meredith Hale on 10/04/2020, 10:23 AM to discuss the potential use of a new treatment, tixagevimab/cilgavimab, for pre-exposure prophylaxis for prevention of coronavirus disease 2019 (COVID-19) caused by the SARS-CoV-2 virus.  This patient is a 60 y.o. female that meets the FDA criteria for Emergency Use Authorization of tixagevimab/cilgavimab for pre-exposure prophylaxis of COVID-19 disease. Pt meets following criteria:  Age >12 yr and weight > 40kg  Not currently infected with SARS-CoV-2 and has no known recent exposure to an individual infected with SARS-CoV-2 AND o Who has moderate to severe immune compromise due to a medical condition or receipt of immunosuppressive medications or treatments and may not mount an adequate immune response to COVID-19 vaccination or  o Vaccination with any available COVID-19 vaccine, according to the approved or authorized schedule, is not recommended due to a history of severe adverse reaction (e.g., severe allergic reaction) to a COVID-19 vaccine(s) and/or COVID-19 vaccine component(s).  o Patient meets the following definition of mod-severe immune compromised status: 8. Other groups not previously mentioned: 4. multiple sclerosis on immunosuppressive therapy  I have spoken and communicated the following to the patient or parent/caregiver regarding COVID monoclonal antibody treatment:  1. FDA has authorized the emergency use of tixagevimab/cilgavimab for the pre-exposure prophylaxis of COVID-19 in patients with moderate-severe immunocompromised status, who meet above EUA criteria.  2. The significant known and potential risks and benefits of COVID monoclonal antibody, and the extent to which such potential risks and benefits are unknown.  3. Information on available alternative treatments and the risks and benefits of those alternatives, including clinical trials.  4. The patient or parent/caregiver has the option to accept or refuse  COVID monoclonal antibody treatment.  After reviewing this information with the patient, agree to receive tixagevimab/cilgavimab.  She is currently in [place], [place] in June.  She would like to come in on 12/24/2020 at 2pm.  I gave her my phone number to call back if she has any issues.    Noreene Filbert, NP, 10/04/2020, 10:23 AM

## 2020-10-18 ENCOUNTER — Other Ambulatory Visit: Payer: Self-pay | Admitting: *Deleted

## 2020-10-18 MED ORDER — MODAFINIL 200 MG PO TABS: 200.0000 mg | ORAL_TABLET | Freq: Every day | ORAL | 1 refills | Status: DC

## 2020-10-19 ENCOUNTER — Telehealth: Payer: Self-pay | Admitting: *Deleted

## 2020-10-19 NOTE — Telephone Encounter (Signed)
Submitted PA modafinil on CMM. Key: [id number] - PA Case ID: [id number]. Waiting on determination from [employer].

## 2020-10-19 NOTE — Telephone Encounter (Signed)
The request has been approved. The authorization is effective from 10/19/2020 to 10/18/2021, as long as the member is enrolled in their current health plan. The request was reviewed and approved by a licensed clinical pharmacist. A written notification letter will follow with additional details.

## 2020-11-01 ENCOUNTER — Telehealth: Payer: 59 | Admitting: Neurology

## 2020-11-02 ENCOUNTER — Telehealth: Payer: Self-pay | Admitting: Neurology

## 2020-11-02 NOTE — Telephone Encounter (Signed)
..   Pt understands that although there may be some limitations with this type of visit, we will take all precautions to reduce any security or privacy concerns.  Pt understands that this will be treated like an in office visit and we will file with pt's insurance, and there may be a patient responsible charge related to this service. ? ?

## 2020-11-02 NOTE — Telephone Encounter (Signed)
error 

## 2020-11-02 NOTE — Telephone Encounter (Signed)
4/25 appointment cancelled due to Md out. LVM/Mychart msg sent informing pt.

## 2020-11-08 ENCOUNTER — Telehealth: Payer: 59 | Admitting: Neurology

## 2020-11-11 ENCOUNTER — Other Ambulatory Visit: Payer: Self-pay | Admitting: *Deleted

## 2020-11-11 MED ORDER — BUPROPION HCL ER (XL) 300 MG PO TB24: 300.0000 mg | ORAL_TABLET | Freq: Every day | ORAL | 0 refills | Status: DC

## 2020-12-24 ENCOUNTER — Ambulatory Visit: Payer: 59

## 2020-12-24 ENCOUNTER — Encounter (HOSPITAL_COMMUNITY): Payer: 59

## 2021-01-08 ENCOUNTER — Other Ambulatory Visit: Payer: Self-pay | Admitting: Neurology

## 2021-01-27 ENCOUNTER — Telehealth: Payer: Self-pay | Admitting: Adult Health

## 2021-01-27 NOTE — Telephone Encounter (Signed)
error 

## 2021-02-02 ENCOUNTER — Encounter: Payer: 59 | Admitting: Osteopathic Medicine

## 2021-02-02 ENCOUNTER — Encounter: Payer: 59 | Admitting: Medical-Surgical

## 2021-02-04 ENCOUNTER — Ambulatory Visit
Admission: RE | Admit: 2021-02-04 | Discharge: 2021-02-04 | Disposition: A | Discharge status: Home / Self Care | Payer: 59 | Source: Ambulatory Visit | Attending: Osteopathic Medicine | Admitting: Osteopathic Medicine

## 2021-02-04 ENCOUNTER — Inpatient Hospital Stay: Admission: RE | Admit: 2021-02-04 | Payer: 59 | Source: Ambulatory Visit

## 2021-02-04 ENCOUNTER — Ambulatory Visit (HOSPITAL_COMMUNITY)
Admission: RE | Admit: 2021-02-04 | Discharge: 2021-02-04 | Disposition: A | Discharge status: Home / Self Care | Payer: 59 | Source: Ambulatory Visit | Attending: Pulmonary Disease | Admitting: Pulmonary Disease

## 2021-02-04 ENCOUNTER — Other Ambulatory Visit: Payer: Self-pay

## 2021-02-04 DIAGNOSIS — Z1231 Encounter for screening mammogram for malignant neoplasm of breast: Secondary | ICD-10-CM

## 2021-02-04 DIAGNOSIS — G35 Multiple sclerosis: Secondary | ICD-10-CM | POA: Insufficient documentation

## 2021-02-04 MED ORDER — FAMOTIDINE IN NACL 20-0.9 MG/50ML-% IV SOLN: 20.0000 mg | Freq: Once | INTRAVENOUS | Status: DC | PRN

## 2021-02-04 MED ORDER — DIPHENHYDRAMINE HCL 50 MG/ML IJ SOLN: 50.0000 mg | Freq: Once | INTRAMUSCULAR | Status: DC | PRN

## 2021-02-04 MED ORDER — EPINEPHRINE 0.3 MG/0.3ML IJ SOAJ: 0.3000 mg | Freq: Once | INTRAMUSCULAR | Status: DC | PRN

## 2021-02-04 MED ORDER — TIXAGEVIMAB (PART OF EVUSHELD) INJECTION
300.0000 mg | Freq: Once | INTRAMUSCULAR | Status: AC
  Administered 2021-02-04: 300 mg via INTRAMUSCULAR

## 2021-02-04 MED ORDER — CILGAVIMAB (PART OF EVUSHELD) INJECTION
300.0000 mg | Freq: Once | INTRAMUSCULAR | Status: AC
  Administered 2021-02-04: 300 mg via INTRAMUSCULAR

## 2021-02-04 MED ORDER — METHYLPREDNISOLONE SODIUM SUCC 125 MG IJ SOLR: 125.0000 mg | Freq: Once | INTRAMUSCULAR | Status: DC | PRN

## 2021-02-04 MED ORDER — ALBUTEROL SULFATE HFA 108 (90 BASE) MCG/ACT IN AERS: 2.0000 | INHALATION_SPRAY | Freq: Once | RESPIRATORY_TRACT | Status: DC | PRN

## 2021-02-04 NOTE — Progress Notes (Signed)
Tolerated Evusheld injections without complication or reaction.

## 2021-02-10 ENCOUNTER — Telehealth (INDEPENDENT_AMBULATORY_CARE_PROVIDER_SITE_OTHER): Payer: 59 | Admitting: Neurology

## 2021-02-10 DIAGNOSIS — D84821 Immunodeficiency due to drugs: Secondary | ICD-10-CM | POA: Diagnosis not present

## 2021-02-10 DIAGNOSIS — R269 Unspecified abnormalities of gait and mobility: Secondary | ICD-10-CM

## 2021-02-10 DIAGNOSIS — G8114 Spastic hemiplegia affecting left nondominant side: Secondary | ICD-10-CM

## 2021-02-10 DIAGNOSIS — R5383 Other fatigue: Secondary | ICD-10-CM

## 2021-02-10 DIAGNOSIS — G35 Multiple sclerosis: Secondary | ICD-10-CM | POA: Diagnosis not present

## 2021-02-10 DIAGNOSIS — Z79899 Other long term (current) drug therapy: Secondary | ICD-10-CM

## 2021-02-10 NOTE — Progress Notes (Signed)
[facility]  PATIENT: Meredith Hale DOB: [birth year]  REFERRING DOCTOR OR PCP: Dr. Merita Norton [facility] neurology) SOURCE: Patient, notes from Dr. Manson Passey, imaging and lab reports   _________________________________   HISTORICAL  CHIEF COMPLAINT:  Multiple Sclerosis   HISTORY OF PRESENT ILLNESS:  Meredith Hale is a 60 year old woman with a relapsing form of secondary progressive MS diagnosed in 2006 though she had a Lhermitte sign in 1999.    Virtual Visit via Video Note I connected with Lew Dawes  on 02/13/21 at  3:00 PM EDT by a video enabled telemedicine application and verified that I am speaking with the correct person.  I discussed the limitations of evaluation and management by telemedicine and the availability of in person appointments. The patient expressed understanding and agreed to proceed.  Patient was at her home and the provider was in the office  History of Present Illness:  Update 02/11/2021  she is on Ocrevus and she tolerates it well.  Last infusion was last week and next infusion in January.     She has not had any exacerbations while on Ocrevus.  She has had some progression of symptoms with worsening gait and reduced left hand strength.   She is currently living in [place].     She had the Evusheld injection last Friday.  She has reduced gait and uses a cane.   Balance is poor and she has left > right leg weakness.  Since losing 30 pounds, she feels slightly better with gait.   She would like to get a new brace for the right leg.    She has lost fine motor skill dexterity in her dominant left hand.   This affects her writing and other skilled tasks.      The left fingers curl.   Due to the spasticity on the left side she takes baclofen and tizanidine with benefit.  Spasticity is generally worse in the late evening.  .  She also has urinary hesitancy and incomplete emptying but no incontinence.   Tamsulosin had not helped much.    She  notes occasional diplopia without a pattern.   She is not always tired when it occurs and can happen in or out of heat.      She has fatigue, worse some days than others.   Sleep is good most nights.  She notes some mild depression and decreased libido.     She takes Vit D supplements.   Her last level was 54 (normal).  She has cognitive issues including reduced word finding, short-term memory, focus/attention.  Additionally she has noted reduced ability to parallel process.  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   She stopped working in 2019 due to her progressive impairments affecting gait and fine motor control.    She received a favorable ruling for her disability.      Observations/Objective:   She is a well-developed well-nourished woman in no acute distress.  The head is normocephalic and atraumatic.  Sclera are anicteric.  Visible skin appears normal.  The neck has a good range of motion.  Pharynx and tongue have normal appearance.  She is alert and fully oriented with fluent speech and good attention, knowledge and memory.  Extraocular muscles are intact.  Facial strength is normal.  Palatal elevation and tongue protrusion are midline.  She appears to have normal strength in the arms.  Rapid alternating movements and finger-nose-finger are performed well.  MS  history: She was diagnosed with MS in 2006.  She was mowing the lawn on a hot day when she felt weak and dizzy.   The next morning she woke up numb form the waist down.   When she did not improve, she saw her doctor.   She had MRIs and a lumbar puncture.   The MRI showed lesions consistent with MS,   She had oligoclonal bands in her CSF.   Initially, she was placed on Betaseron but had side effects causing body aches.  Then she was placed on Copaxone and switched to Tecfidera after having an exacerbation.  She tolerated Tecfidera well but her lymphocyte count dropped and she was switched to Aubagio.  More recently she switched  from Aubagio to Ocrevus to be on a more effective medication.   She tolerates it well and her last infusion was Nov 30, 2017.   Her first infusion was November 2018.      Imaging studies: January 2018 MRI of the brain and cervical spine:   The brain showed several white matter foci which could be consistent with MS.  The cervical spinal cord showed at least 5 foci c/w demyelination.     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    06/25/2018 MRI of the brain and cervical spine: The brain is unchanged.  The cervical spine is unchanged.  There is a mild Chiari malformation with 6-7 mm of left cerebellar ectopia.   The spinal cord is of normal caliber.    There are T2 hyperintense foci anteromedially adjacent to C2, posterior to the left adjacent to C2, laterally to the right adjacent to C2-C3, lateral to the left adjacent to C4 and posterolateral to the left adjacent to C4-C5.  Additionally, there are degenerative changes at C4-C5 through C6-C7.  03/21/2019 MRI: The MRI of the brain showed similar pattern of T2/FLAIR hyperintense foci in the hemispheres.  There is 8 mm cerebellar ectopia consistent with a Chiari I malformation and increased compared to the previous MRI.  (This was performed after neurologic symptoms that followed an epidural steroid injection)  Other: She has scoliosis causing some back pain and has been told it has worsened.   NCV/EMG 02/25/2019 shows mild left ulnar neuropathy, not severe enough to explain atrophy in the left hand.  Reduced recruitment in the C7 and C8 myotomes most consistent with a chronic demyelinating plaque involving the anterior horn cells.    REVIEW OF SYSTEMS: Constitutional: No fevers, chills, sweats, or change in appetite.  She has fatigue. Eyes: No visual changes, double vision, eye pain.  There is light sensitivity. Ear, nose and throat: No hearing loss, ear pain, nasal congestion, sore throat.  She has tinnitus. Cardiovascular: No chest  pain, palpitations Respiratory:  No shortness of breath at rest or with exertion.   No wheezes GastrointestinaI: No nausea, vomiting, diarrhea, abdominal pain, fecal incontinence Genitourinary:  No dysuria, urinary retention or frequency.  No nocturia. Musculoskeletal:  No neck pain, back pain.   She has scoliosis.   Integumentary: No rash, pruritus currently though she does have psoriasis and some skin moles Neurological: as above Psychiatric: No depression at this time.  No anxiety Endocrine: No palpitations, diaphoresis, change in appetite, change in weigh or increased thirst Hematologic/Lymphatic:  No anemia, purpura, petechiae. Allergic/Immunologic: No itchy/runny eyes, nasal congestion, recent allergic reactions, rashes  ALLERGIES: Allergies  Allergen Reactions   Colophony [Pinus Strobus]    Formaldehyde    Propylene Glycol  Quaternium-15     HOME MEDICATIONS:  Current Outpatient Medications:    AMBULATORY NON FORMULARY MEDICATION, To [facility] Attn Douglas Leclair Right knee brace for genu recurvatum to assist stability and ambulation. Left ankle-foot orthosis for foot drop to assess stability and ambulation. Duration of need = lifelong., Disp: 1 each, Rfl: 0   baclofen (LIORESAL) 20 MG tablet, Take 1 tablet (20 mg total) by mouth 4 (four) times daily., Disp: 360 tablet, Rfl: 3   Biotin 1000 MCG tablet, Take 1,000 mcg by mouth daily. , Disp: , Rfl:    buPROPion (WELLBUTRIN XL) 300 MG 24 hr tablet, TAKE 1 TABLET BY MOUTH ONCE A DAY, Disp: 90 tablet, Rfl: 0   CALCIUM-VITAMIN D PO, Take by mouth., Disp: , Rfl:    Cholecalciferol (D3-1000) 25 MCG (1000 UT) capsule, Take 1,000 Units by mouth daily., Disp: , Rfl:    Clobetasol Propionate (CLOBEX SPRAY) 0.05 % external spray, Clobex 0.05 % topical spray, Disp: , Rfl:    CLODERM 0.1 % cream, APPLY TOPICALLY TO THE AFFECTED AREA EVERY DAY, Disp: , Rfl:    fluocinonide (LIDEX) 0.05 % external solution, Apply 1  application topically as needed., Disp: , Rfl:    Misc. Devices (WALKER) MISC, Upright walker for MS and gait disturbance.    G35 and R26.9, Disp: 1 each, Rfl: 0   modafinil (PROVIGIL) 200 MG tablet, Take 1 tablet (200 mg total) by mouth daily., Disp: 90 tablet, Rfl: 1   Multiple Vitamins-Minerals (MULTIVITAL) tablet, Take 1 tablet by mouth daily., Disp: , Rfl:    naproxen (NAPROSYN) 500 MG tablet, Take 1 tablet (500 mg total) by mouth 2 (two) times daily as needed., Disp: 180 tablet, Rfl: 1   ocrelizumab 600 mg in sodium chloride 0.9 % 500 mL, Inject 600 mg into the vein every 6 (six) months., Disp: , Rfl:    Omega-3 Fatty Acids (FISH OIL) 1000 MG CAPS, Take by mouth., Disp: , Rfl:    omeprazole (PRILOSEC) 40 MG capsule, Take 1 capsule (40 mg total) by mouth as needed., Disp: 90 capsule, Rfl: 3   Probiotic Product (ALIGN PO), Take by mouth., Disp: , Rfl:    solifenacin (VESICARE) 5 MG tablet, Take 1 tablet (5 mg total) by mouth daily., Disp: 90 tablet, Rfl: 3   tamsulosin (FLOMAX) 0.4 MG CAPS capsule, Take 1 capsule (0.4 mg total) by mouth daily., Disp: 90 capsule, Rfl: 3   tiZANidine (ZANAFLEX) 4 MG tablet, Take 1 to 2 capsules nightly, Disp: 180 tablet, Rfl: 3  PAST MEDICAL HISTORY: Past Medical History:  Diagnosis Date   Multiple sclerosis (HCC)    Psoriasis    Scoliosis     PAST SURGICAL HISTORY: Past Surgical History:  Procedure Laterality Date   AUGMENTATION MAMMAPLASTY     bone navicular  07/05/2006   FINGER GANGLION CYST EXCISION     FINGER SURGERY  06/28/2015   capsular release due to injury   FOOT SURGERY     right and left foot   FOOT SURGERY     HEMORRHOIDECTOMY WITH HEMORRHOID BANDING  04/19/2017   LUMBAR EPIDURAL INJECTION  04/27/2017   lymph node removal  1976   REFRACTIVE SURGERY  06/02/1994   THYROID SURGERY     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    FAMILY HISTORY: Family History  Adopted: Yes  Family history unknown: Yes     SOCIAL HISTORY:  Social History   Socioeconomic History   Marital  status: Single    Spouse name: Not on file   Number of children: 0   Years of education: Masters   Highest education level: Not on file  Occupational History   Occupation: unemployed  Tobacco Use   Smoking status: Former    Packs/day: 1.00    Types: Cigarettes   Smokeless tobacco: Never  Vaping Use   Vaping Use: Never used  Substance and Sexual Activity   Alcohol use: Yes    Comment: 1 per week   Drug use: Never   Sexual activity: Yes    Partners: Male  Other Topics Concern   Not on file  Social History Narrative   Lives w/ significant other, Sharen HintKevin Fricke   Caffeine use: 2 cups per day   Left handed but transitioning to be right handed d/t being weak in left hand   Social Determinants of Corporate investment bankerHealth   Financial Resource Strain: Not on file  Food Insecurity: Not on file  Transportation Needs: Not on file  Physical Activity: Not on file  Stress: Not on file  Social Connections: Not on file  Intimate Partner Violence: Not on file     ______________________________________________________  ASSESSMENT AND PLAN  1. 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 Gait abnormality   4. Left spastic hemiplegia (HCC)   5. Other fatigue      1.   She will continue Ocrevus for her relapsing remitting MS.   Her next infusion is in January 2023.   [place] lives in [place] now but will come back for infusion 2.   She is permanently disabled due to a combination of physical (weakness, spasticity and reduced coordination of dominant left side) and cognitive issues (fatigue, reduced ability to multitask, memory disturbance) related to her MS, she is permanently disabled.  Her EDSS is 6.0 even wearing AFO brace. 3.   Prescription for right leg brace. 4.   She will return to see me in 6 months but sooner if there are new or worsening neurologic symptoms.   Follow Up  Instructions: I discussed the assessment and treatment plan with the patient. The patient was provided an opportunity to ask questions and all were answered. The patient agreed with the plan and demonstrated an understanding of the instructions.    The patient was advised to call back or seek an in-person evaluation if the symptoms worsen or if the condition fails to improve as anticipated.  I provided 26 minutes of non-face-to-face time during this encounter.   Shraga Custard A. Epimenio FootSater, MD, PhD, FAAN Certified in Neurology, Clinical Neurophysiology, Sleep Medicine, Pain Medicine and Neuroimaging Director, [facility] at [facility]  [facility] [address], [address] [place], [place] [address] [phone]

## 2021-02-13 DIAGNOSIS — Z79899 Other long term (current) drug therapy: Secondary | ICD-10-CM | POA: Insufficient documentation

## 2021-02-13 DIAGNOSIS — D84821 Immunodeficiency due to drugs: Secondary | ICD-10-CM | POA: Insufficient documentation

## 2021-02-14 ENCOUNTER — Encounter: Payer: Self-pay | Admitting: Neurology

## 2021-02-15 ENCOUNTER — Telehealth: Payer: Self-pay

## 2021-02-15 NOTE — Telephone Encounter (Signed)
Referral for orthotics sent to [facility] in [place]. P: [phone]

## 2021-02-17 ENCOUNTER — Encounter: Payer: 59 | Admitting: Osteopathic Medicine

## 2021-03-29 ENCOUNTER — Other Ambulatory Visit: Payer: Self-pay

## 2021-03-29 MED ORDER — TIZANIDINE HCL 4 MG PO TABS: ORAL_TABLET | ORAL | 3 refills | Status: DC

## 2021-03-29 MED ORDER — NAPROXEN 500 MG PO TABS: 500.0000 mg | ORAL_TABLET | Freq: Two times a day (BID) | ORAL | 3 refills | Status: DC | PRN

## 2021-03-29 MED ORDER — SOLIFENACIN SUCCINATE 5 MG PO TABS: 5.0000 mg | ORAL_TABLET | Freq: Every day | ORAL | 3 refills | Status: DC

## 2021-03-29 MED ORDER — MODAFINIL 200 MG PO TABS: 200.0000 mg | ORAL_TABLET | Freq: Every day | ORAL | 1 refills | Status: DC

## 2021-03-29 MED ORDER — BUPROPION HCL ER (XL) 300 MG PO TB24: 300.0000 mg | ORAL_TABLET | Freq: Every day | ORAL | 3 refills | Status: DC

## 2021-03-29 NOTE — Addendum Note (Signed)
Addended by: Melanee Spry on: 03/29/2021 09:38 AM   Modules accepted: Orders

## 2021-04-01 ENCOUNTER — Other Ambulatory Visit: Payer: Self-pay | Admitting: Neurology

## 2021-04-05 ENCOUNTER — Other Ambulatory Visit: Payer: Self-pay | Admitting: Neurology

## 2021-04-05 MED ORDER — MODAFINIL 200 MG PO TABS: 200.0000 mg | ORAL_TABLET | Freq: Every day | ORAL | 1 refills | Status: DC

## 2021-04-05 NOTE — Telephone Encounter (Signed)
I have routed this request to Dr Sater for review. The pt is due for the medication and [place] registry was verified.  

## 2021-04-07 ENCOUNTER — Telehealth: Payer: Self-pay | Admitting: *Deleted

## 2021-04-07 NOTE — Telephone Encounter (Signed)
Submitted PA modafinil on CMM. Key: [id number]. Waiting on determination from [facility].  Looks like pt now has [employer]. ID: [id number]. Phone# [phone].

## 2021-04-12 NOTE — Telephone Encounter (Signed)
PA denied. Faxed appeal letter to [phone]. Marked urgent, waiting on determination.

## 2021-06-16 ENCOUNTER — Other Ambulatory Visit: Payer: Self-pay | Admitting: Neurology

## 2021-07-20 ENCOUNTER — Telehealth: Payer: Self-pay | Admitting: *Deleted

## 2021-07-20 NOTE — Telephone Encounter (Signed)
Per infusion suite/Kim: "Meredith Hale 07/23/1960 just called in to cancel her future infusion appointments. She didn't give an explanation. I asked her if she had notified y'all and she said that would be her next call...so just wanted to give you the heads up"  I reviewed patient chart. Appears she has moved to [place], [place] and transitioned care to: [facility] per care everywhere.

## 2021-08-07 ENCOUNTER — Other Ambulatory Visit: Payer: Self-pay | Admitting: Neurology

## 2021-08-18 ENCOUNTER — Telehealth: Payer: 59 | Admitting: Neurology

## 2021-10-04 IMAGING — MG DIGITAL SCREENING BREAST BILAT IMPLANT W/ TOMO W/ CAD
8 of 12 series · 8 of 28 positions shown · non-contrast
Comparison: Previous exam(s).

CLINICAL DATA: Screening.

EXAM:
DIGITAL SCREENING BILATERAL MAMMOGRAM WITH IMPLANTS, CAD AND TOMO
The patient has retropectoral implants. Standard and implant
displaced views were performed.

[L MLO]
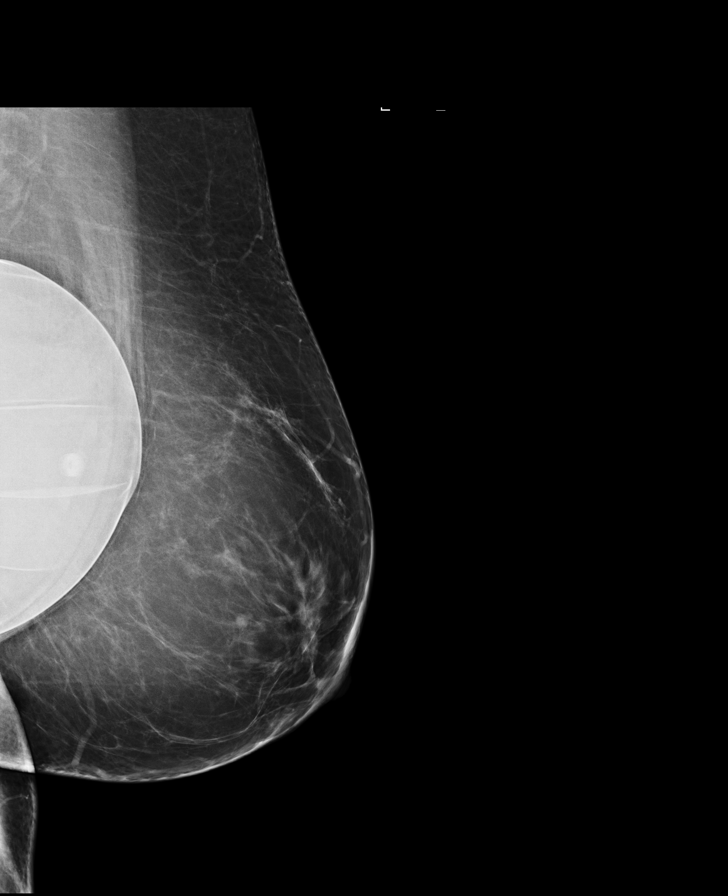

[R MLO]
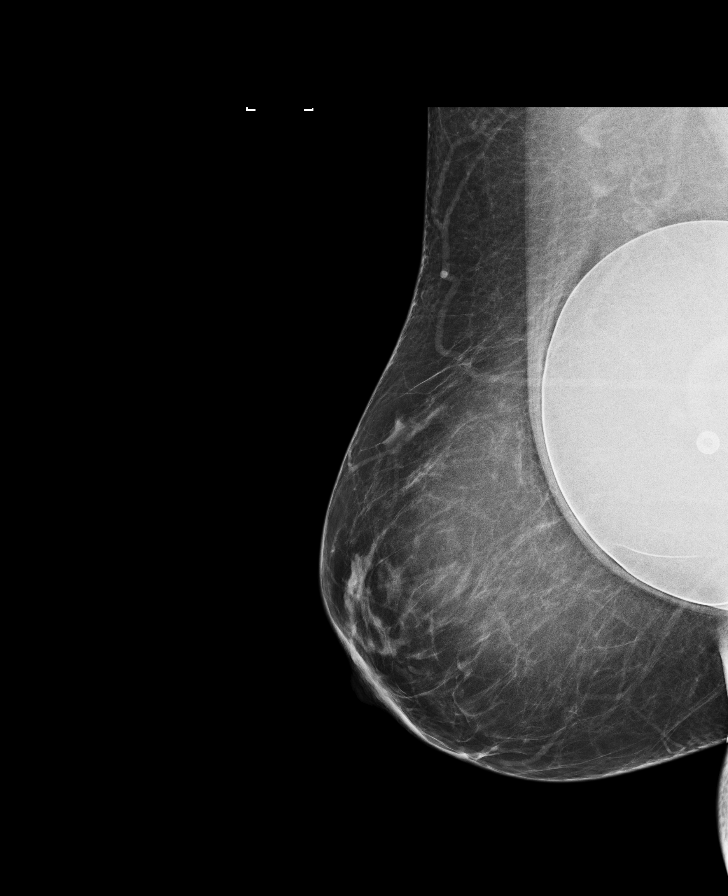

[R CC]
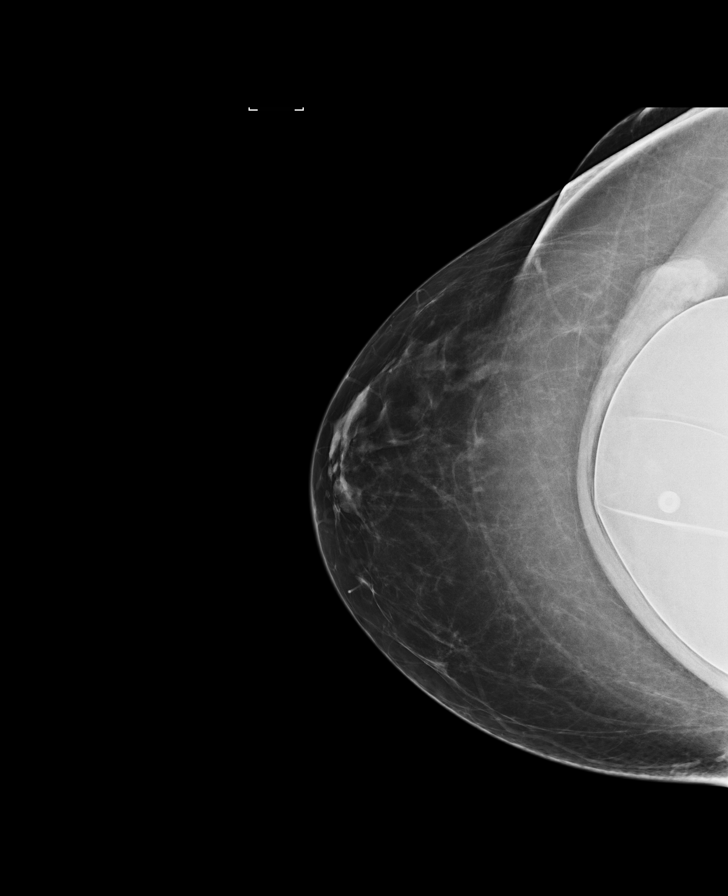

[L CC]
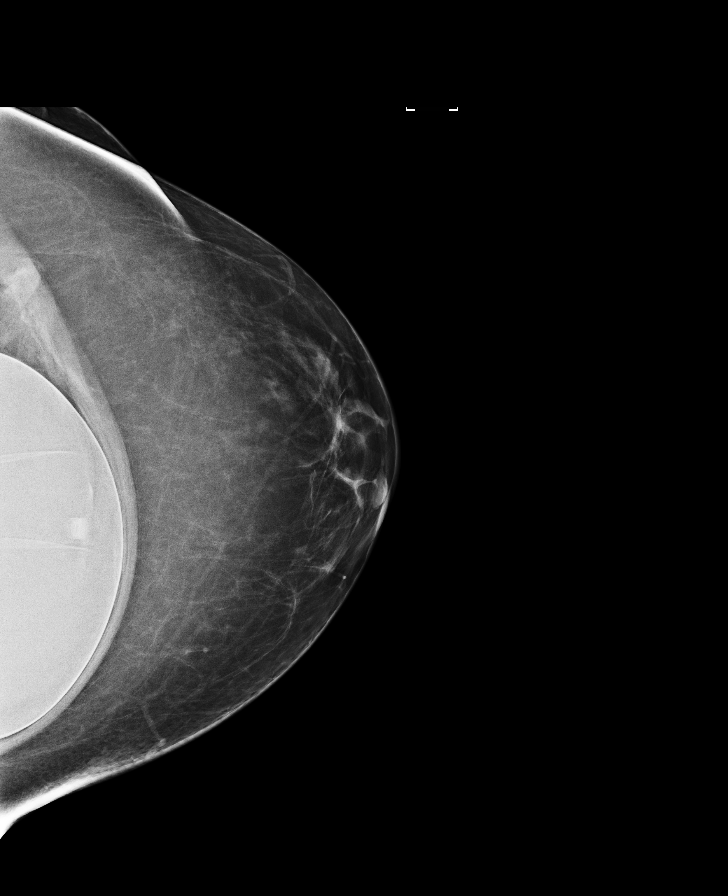

[R MLO synth-2D]
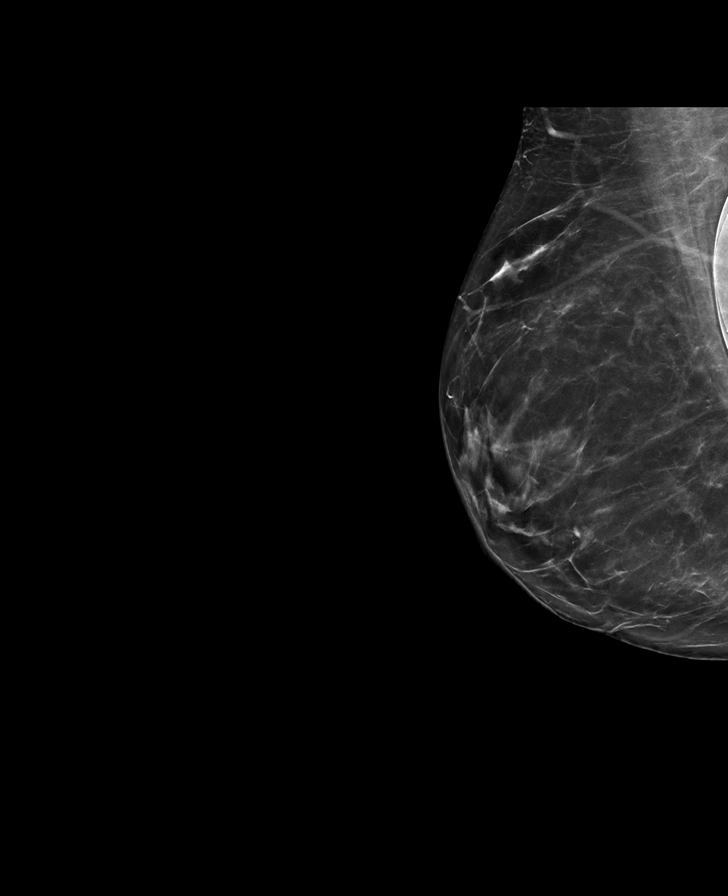

[L CC synth-2D]
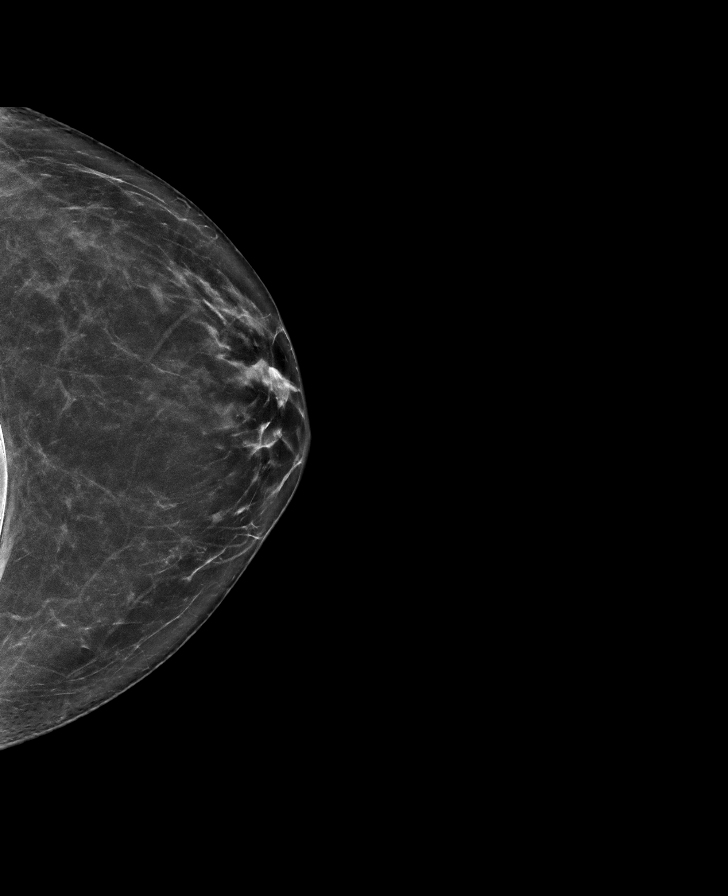

[R CC synth-2D]
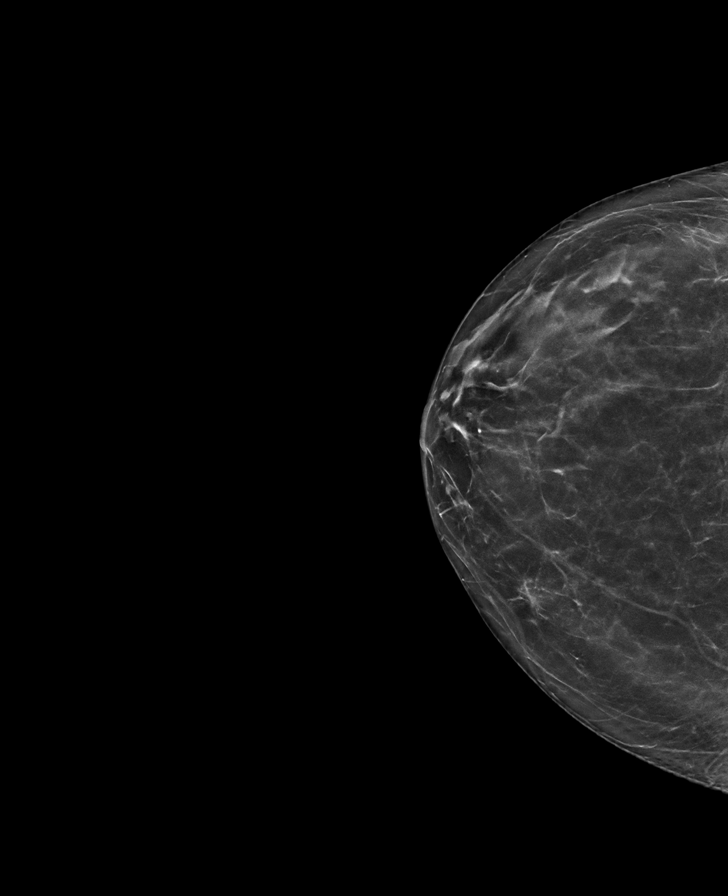

[L MLO synth-2D]
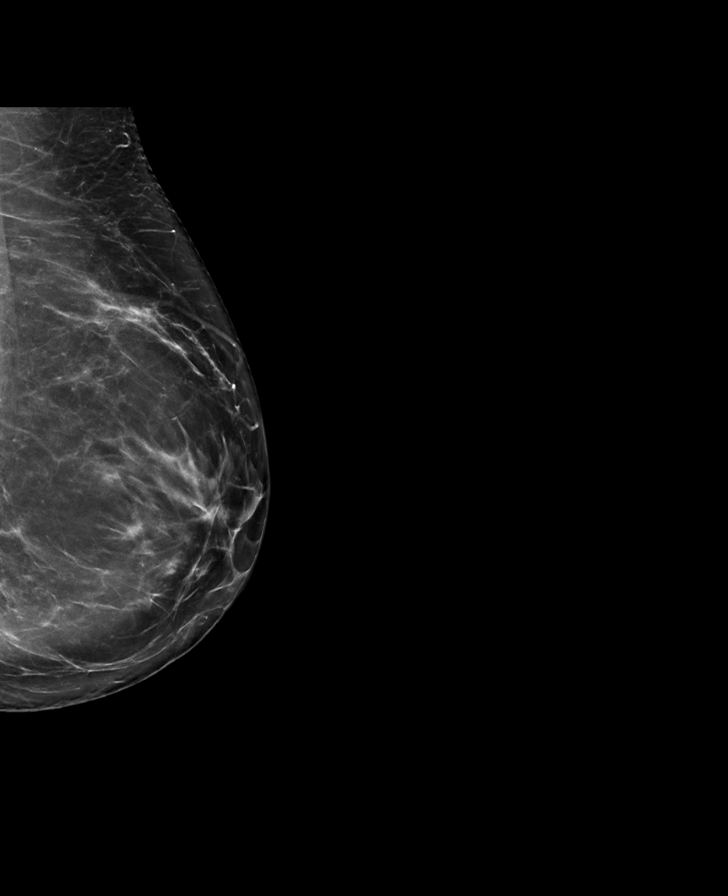

[8 of 28 positions shown; findings below may reference images not displayed]

ACR Breast Density Category b: There are scattered areas of
fibroglandular density.
FINDINGS: There are no findings suspicious for malignancy. Images were
processed with CAD.
IMPRESSION: No mammographic evidence of malignancy. A result letter of this
screening mammogram will be mailed directly to the patient.

RECOMMENDATION:
Screening mammogram in one year. (Code:60-T-8Z4)

BI-RADS CATEGORY  1:  Negative.

## 2022-01-27 ENCOUNTER — Other Ambulatory Visit: Payer: Self-pay | Admitting: Neurology

## 2022-01-30 ENCOUNTER — Other Ambulatory Visit: Payer: Self-pay | Admitting: Neurology

## 2022-04-02 ENCOUNTER — Other Ambulatory Visit: Payer: Self-pay | Admitting: Neurology

## 2022-04-13 ENCOUNTER — Encounter: Payer: Self-pay | Admitting: Neurology

## 2022-05-02 NOTE — Progress Notes (Unsigned)
[facility]  PATIENT: Meredith Hale DOB: 1961-01-07  REFERRING DOCTOR OR PCP: Dr. Merita Norton [facility] neurology) SOURCE: Patient, notes from Dr. Manson Passey, imaging and lab reports   _________________________________   HISTORICAL  CHIEF COMPLAINT:  No chief complaint on file.   HISTORY OF PRESENT ILLNESS:  Meredith Hale is a 61 year old woman with a relapsing form of secondary progressive MS diagnosed in 2006 though she had a Lhermitte sign in 1999.    Update 05/02/2022 She is on Ocrevus and she tolerates it well but the LFTs were increased last week.  Her last Ocrevus was 01/02/2020 and next infusion is 08/02/20.   She notes more difficulty with handwriting due to more left arm weakness and reduced fine motor control.   Sh had an OT evaluation earlier today.   She had wanted to have a documented assessment. .  She has not had any exacerbations while on Ocrevus.  However, she has had some progression of symptoms with worsening gait and reduced left hand strength.   She has reduced gait and uses a cane.   Balance is poor.  She has no falls since last visit but had some earlier this year.    She has lost fine motor skill dexterity in her dominant left hand.   Writing and preparing skills is more difficult.   Left leg also affected more .   The left fingers curl.   Due to the spasticity on the left side she takes baclofen and tizanidine with benefit for legs bu not the left arm..  She also has urinary hesitancy but no incontinence.   Review of the last brain MRI shows she has several T2 hyperintense foci in her spinal cord.     She has fatigue, worse some days than others.   Sleep is good most nights.  She notes some mild depression and decreased libido.     She takes Vit D supplements.   Her last level was 54 (normal).  She has cognitive issues including reduced word finding, short-term memory, focus/attention.  Additionally she has noted reduced ability to parallel  process.  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   She stopped working in 2019 due to her progressive impairments affecting gait and fine motor control.      MS history: She was diagnosed with MS in 2006.  She was mowing the lawn on a hot day when she felt weak and dizzy.   The next morning she woke up numb from the waist down.   When she did not improve, she saw her doctor.   She had MRIs and a lumbar puncture.   The MRI showed lesions consistent with MS,   She had oligoclonal bands in her CSF.   Initially, she was placed on Betaseron but had side effects causing body aches.  Then she was placed on Copaxone and switched to Tecfidera after having an exacerbation.  She tolerated Tecfidera well but her lymphocyte count dropped and she was switched to Aubagio.  More recently she switched from Aubagio to Ocrevus to be on a more effective medication.   Her first infusion was November 2018 and her last infusion was July 2022.  Imaging studies: January 2018 MRI of the brain and cervical spine:   The brain showed several white matter foci which could be consistent with MS.  The cervical spinal cord showed at least 5 foci c/w demyelination.     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    06/25/2018 MRI of the brain and cervical spine: The brain is unchanged.  The cervical spine is unchanged.  There is a mild Chiari malformation with 6-7 mm of left cerebellar ectopia.   The spinal cord is of normal caliber.    There are T2 hyperintense foci anteromedially adjacent to C2, posterior to the left adjacent to C2, laterally to the right adjacent to C2-C3, lateral to the left adjacent to C4 and posterolateral to the left adjacent to C4-C5.  Additionally, there are degenerative changes at C4-C5 through C6-C7.  03/21/2019 MRI: The MRI of the brain showed similar pattern of T2/FLAIR hyperintense foci in the hemispheres.  There is 8 mm cerebellar ectopia consistent with a Chiari I malformation  and increased compared to the previous MRI.  (This was performed after neurologic symptoms that followed an epidural steroid injection)  MRI of the cervical spine 04/28/2020 showed T2 hyperintense foci anteromedially adjacent to C2, posterior to the left adjacent to C2, l towards the left adjacent to C3, lateral to the left adjacent to C4 and posterolateral to the left adjacent to C4-C5.  There are minimal disc degenerative changes at C4-C5 through C6-C7 and facet hypertrophy to the right at C4-C5.  No spinal stenosis or nerve root compression.  MRI of the brain 04/28/2020 showed just a couple punctate T2/FLAIR hyperintense foci in the subcortical or deep white matter.  There is 7 mm of cerebellar ectopia without pegging and a pineal cyst.  Other: She has scoliosis causing some back pain and has been told it has worsened.   NCV/EMG 02/25/2019 shows mild left ulnar neuropathy, not severe enough to explain atrophy in the left hand.  Reduced recruitment in the C7 and C8 myotomes most consistent with a chronic demyelinating plaque involving the anterior horn cells.  Laboratory tests.  She had hepatitis lab and TB quant to Farren test 01/20/2022.  These were negative.  Vitamin D was normal 10/14/2021  REVIEW OF SYSTEMS: Constitutional: No fevers, chills, sweats, or change in appetite.  She has fatigue. Eyes: No visual changes, double vision, eye pain.  There is light sensitivity. Ear, nose and throat: No hearing loss, ear pain, nasal congestion, sore throat.  She has tinnitus. Cardiovascular: No chest pain, palpitations Respiratory:  No shortness of breath at rest or with exertion.   No wheezes GastrointestinaI: No nausea, vomiting, diarrhea, abdominal pain, fecal incontinence Genitourinary:  No dysuria, urinary retention or frequency.  No nocturia. Musculoskeletal:  No neck pain, back pain.   She has scoliosis.   Integumentary: No rash, pruritus currently though she does have psoriasis and some skin  moles Neurological: as above Psychiatric: No depression at this time.  No anxiety Endocrine: No palpitations, diaphoresis, change in appetite, change in weigh or increased thirst Hematologic/Lymphatic:  No anemia, purpura, petechiae. Allergic/Immunologic: No itchy/runny eyes, nasal congestion, recent allergic reactions, rashes  ALLERGIES: Allergies  Allergen Reactions   Colophony [Pinus Strobus]    Formaldehyde    Propylene Glycol    Quaternium-15     HOME MEDICATIONS:  Current Outpatient Medications:    AMBULATORY NON FORMULARY MEDICATION, To Acupuncturist and Orthotics Attn Douglas Leclair Right knee brace for genu recurvatum to assist stability and ambulation. Left ankle-foot orthosis for foot drop to assess stability and ambulation. Duration of need = lifelong., Disp: 1 each, Rfl: 0   baclofen (LIORESAL) 20 MG tablet, Take 1 tablet (20 mg total) by mouth 4 (four) times daily., Disp: 360 tablet,  Rfl: 3   Biotin 1000 MCG tablet, Take 1,000 mcg by mouth daily. , Disp: , Rfl:    buPROPion (WELLBUTRIN XL) 300 MG 24 hr tablet, Take 1 tablet (300 mg total) by mouth daily., Disp: 90 tablet, Rfl: 3   CALCIUM-VITAMIN D PO, Take by mouth., Disp: , Rfl:    Cholecalciferol (D3-1000) 25 MCG (1000 UT) capsule, Take 1,000 Units by mouth daily., Disp: , Rfl:    Clobetasol Propionate (CLOBEX SPRAY) 0.05 % external spray, Clobex 0.05 % topical spray, Disp: , Rfl:    CLODERM 0.1 % cream, APPLY TOPICALLY TO THE AFFECTED AREA EVERY DAY, Disp: , Rfl:    fluocinonide (LIDEX) 0.05 % external solution, Apply 1 application topically as needed., Disp: , Rfl:    Misc. Devices (WALKER) MISC, Upright walker for MS and gait disturbance.    G35 and R26.9, Disp: 1 each, Rfl: 0   modafinil (PROVIGIL) 200 MG tablet, Take 1 tablet (200 mg total) by mouth daily., Disp: 90 tablet, Rfl: 1   Multiple Vitamins-Minerals (MULTIVITAL) tablet, Take 1 tablet by mouth daily., Disp: , Rfl:    naproxen (NAPROSYN) 500 MG  tablet, Take 1 tablet (500 mg total) by mouth 2 (two) times daily as needed., Disp: 180 tablet, Rfl: 3   ocrelizumab 600 mg in sodium chloride 0.9 % 500 mL, Inject 600 mg into the vein every 6 (six) months., Disp: , Rfl:    Omega-3 Fatty Acids (FISH OIL) 1000 MG CAPS, Take by mouth., Disp: , Rfl:    omeprazole (PRILOSEC) 40 MG capsule, Take 1 capsule (40 mg total) by mouth as needed., Disp: 90 capsule, Rfl: 3   Probiotic Product (ALIGN PO), Take by mouth., Disp: , Rfl:    solifenacin (VESICARE) 5 MG tablet, TAKE 1 TABLET BY MOUTH EVERY DAY, Disp: 90 tablet, Rfl: 1   tamsulosin (FLOMAX) 0.4 MG CAPS capsule, Take 1 capsule (0.4 mg total) by mouth daily., Disp: 90 capsule, Rfl: 3   tiZANidine (ZANAFLEX) 4 MG tablet, May take 1-2 tablets nightly., Disp: 180 tablet, Rfl: 3  PAST MEDICAL HISTORY: Past Medical History:  Diagnosis Date   Multiple sclerosis (HCC)    Psoriasis    Scoliosis     PAST SURGICAL HISTORY: Past Surgical History:  Procedure Laterality Date   AUGMENTATION MAMMAPLASTY     bone navicular  07/05/2006   FINGER GANGLION CYST EXCISION     FINGER SURGERY  06/28/2015   capsular release due to injury   FOOT SURGERY     right and left foot   FOOT SURGERY     HEMORRHOIDECTOMY WITH HEMORRHOID BANDING  04/19/2017   LUMBAR EPIDURAL INJECTION  04/27/2017   lymph node removal  1976   REFRACTIVE SURGERY  06/02/1994   THYROID SURGERY     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    FAMILY HISTORY: Family History  Adopted: Yes  Family history unknown: Yes    SOCIAL HISTORY:  Social History   Socioeconomic History   Marital status: Single    Spouse name: Not on file   Number of children: 0   Years of education: Masters   Highest education level: Not on file  Occupational History   Occupation: unemployed  Tobacco Use   Smoking status: Former    Packs/day: 1.00    Types: Cigarettes   Smokeless tobacco: Never  Vaping Use   Vaping Use: Never  used  Substance and Sexual Activity   Alcohol use: Yes  Comment: 1 per week   Drug use: Never   Sexual activity: Yes    Partners: Male  Other Topics Concern   Not on file  Social History Narrative   Lives w/ significant other, Sharen Hint   Caffeine use: 2 cups per day   Left handed but transitioning to be right handed d/t being weak in left hand   Social Determinants of Corporate investment banker Strain: Not on file  Food Insecurity: Not on file  Transportation Needs: Not on file  Physical Activity: Not on file  Stress: Not on file  Social Connections: Not on file  Intimate Partner Violence: Not on file     PHYSICAL EXAM  There were no vitals filed for this visit.   There is no height or weight on file to calculate BMI.   General: The patient is well-developed and well-nourished and in no acute distress   Neck: The neck is supple, no carotid bruits are noted.  She has mild tenderness midline at the skull base   Skin: Extremities are without rash or edema.  Neurologic Exam  Mental status: The patient is alert and oriented x 3 at the time of the examination. The patient has apparent normal recent and remote memory, with an apparently normal attention span and concentration ability.   Speech is normal.  Cranial nerves: Extraocular movements are full.   There is good facial sensation to soft touch bilaterally.Facial strength is normal.  Trapezius and sternocleidomastoid strength is normal. No dysarthria is noted. No obvious hearing deficits are noted.  Motor:  There is some increased muscle tone in the left leg.. Strength is  5 / 5 on the right arm and 4+/5 right leg.   Strength is 4-/5 in the left arm except 2+ to 3/5 in the intrinsic hand muscles.  She has atrophy of the intrinsic hand muscles..  Strength is 2-/5 in the left iliopsoas, 2+/5 in the left quadriceps, 3/5 in the left hamstrings, 2-/5 last ankle extension  Sensory: She has intact sensation to touch  sensation.  She has reduced sensation to vibration in the left arm and leg relative to the right.  Coordination: Cerebellar testing reveals reduced left finger-nose-finger and she can't do left heel-to-shin .  She has mildly reduced right heel-to-shin.  Gait and station: Station is normal.  She is wearing AFO braces.  She has very poor gait due to the left weakness.  She is unable to walk more than a few feet without the use of her cane or support..  She is unable to do a tandem walk.  The Romberg is borderline..   Reflexes: Deep tendon reflexes are symmetric and increased on the left relative to the right.   Plantar responses are flexor on the right and extensor on the left.  ______________________________________________________  ASSESSMENT AND PLAN  No diagnosis found.   1.   She will continue Ocrevus for her relapsing remitting MS.   Her next infusion is in January 2022.   We will check an MRI of the brain and cervical spine to determine if there has been additional progression.  If present, consider a different disease modifying agent. 2.   She is permanently disabled due to a combination of physical (weakness, spasticity and reduced coordination of dominant left side) and cognitive issues (fatigue, reduced ability to multitask, memory disturbance) related to her MS, she is permanently disabled.  Her EDSS is 6.0 even wearing AFO brace. 3.   Wellbutrin XL 300 mg.  4.   She will return to see me in 6 months but sooner if there are new or worsening neurologic symptoms.  45-minute office visit with the majority of the time spent face-to-face for history and physical, discussion/counseling and decision-making.  Additional time with record review and documentation.    Faryn Sieg A. Felecia Shelling, MD, PhD, FAAN Certified in Neurology, Clinical Neurophysiology, Sleep Medicine, Pain Medicine and Neuroimaging Director, [place] [facility] [address] [place], [place] [address] [phone]

## 2022-05-03 ENCOUNTER — Encounter: Payer: Self-pay | Admitting: Neurology

## 2022-05-03 ENCOUNTER — Ambulatory Visit: Payer: Medicare HMO | Admitting: Neurology

## 2022-05-03 VITALS — BP 128/73 | HR 75 | Ht 67.0 in | Wt 156.4 lb

## 2022-05-03 DIAGNOSIS — R5383 Other fatigue: Secondary | ICD-10-CM | POA: Diagnosis not present

## 2022-05-03 DIAGNOSIS — G35 Multiple sclerosis: Secondary | ICD-10-CM | POA: Diagnosis not present

## 2022-05-03 DIAGNOSIS — G935 Compression of brain: Secondary | ICD-10-CM

## 2022-05-03 DIAGNOSIS — G8114 Spastic hemiplegia affecting left nondominant side: Secondary | ICD-10-CM | POA: Diagnosis not present

## 2022-05-03 DIAGNOSIS — R269 Unspecified abnormalities of gait and mobility: Secondary | ICD-10-CM

## 2022-05-03 MED ORDER — TIZANIDINE HCL 4 MG PO TABS: ORAL_TABLET | ORAL | 3 refills | Status: DC

## 2022-05-03 MED ORDER — BACLOFEN 20 MG PO TABS: 20.0000 mg | ORAL_TABLET | Freq: Four times a day (QID) | ORAL | 3 refills | Status: DC

## 2022-05-03 MED ORDER — BUPROPION HCL ER (XL) 300 MG PO TB24: 300.0000 mg | ORAL_TABLET | Freq: Every day | ORAL | 3 refills | Status: DC

## 2022-05-03 MED ORDER — SOLIFENACIN SUCCINATE 5 MG PO TABS: 5.0000 mg | ORAL_TABLET | Freq: Every day | ORAL | 4 refills | Status: DC

## 2022-05-04 LAB — IGG, IGA, IGM
IgA/Immunoglobulin A, Serum: 280 mg/dL (ref 87–352)
IgG (Immunoglobin G), Serum: 861 mg/dL (ref 586–1602)
IgM (Immunoglobulin M), Srm: 60 mg/dL (ref 26–217)

## 2022-05-08 ENCOUNTER — Telehealth: Payer: Self-pay

## 2022-05-08 NOTE — Telephone Encounter (Signed)
Received Ocrevus start form from Dr. Felecia Shelling. Faxed to [facility]. Received a receipt of confirmation.  Dr. Felecia Shelling signed De Nurse order. Given to infusion suite for processing.

## 2022-05-24 NOTE — Telephone Encounter (Signed)
Called pt. Received fax from [facility] that household size/household income missing on consent form and they were told not to contact her directly.  Household size: 1, household income: inder 75,000 she states. I filled this in on form and re-faxed to [employer] at [phone]. Received fax confirmation.

## 2022-06-13 ENCOUNTER — Telehealth: Payer: Self-pay | Admitting: *Deleted

## 2022-06-13 NOTE — Telephone Encounter (Signed)
Received fax from [employer] that PA approved until 07/17/23.  

## 2022-06-13 NOTE — Telephone Encounter (Signed)
Faxed completed/signed PA baclofen back to [facility] at [phone]. Received fax confirmation. Waiting on determination.

## 2022-08-30 ENCOUNTER — Telehealth: Payer: Self-pay | Admitting: *Deleted

## 2022-08-30 NOTE — Telephone Encounter (Signed)
Pt here today for infusion. Gave completed/signed form back to pt that she requested be filled out while here.

## 2022-11-08 ENCOUNTER — Ambulatory Visit (INDEPENDENT_AMBULATORY_CARE_PROVIDER_SITE_OTHER): Payer: Medicare HMO | Admitting: Neurology

## 2022-11-08 ENCOUNTER — Encounter: Payer: Self-pay | Admitting: Neurology

## 2022-11-08 VITALS — BP 127/78 | HR 65 | Ht 67.0 in | Wt 175.0 lb

## 2022-11-08 DIAGNOSIS — R4189 Other symptoms and signs involving cognitive functions and awareness: Secondary | ICD-10-CM

## 2022-11-08 DIAGNOSIS — R5383 Other fatigue: Secondary | ICD-10-CM

## 2022-11-08 DIAGNOSIS — R269 Unspecified abnormalities of gait and mobility: Secondary | ICD-10-CM | POA: Diagnosis not present

## 2022-11-08 DIAGNOSIS — G35 Multiple sclerosis: Secondary | ICD-10-CM | POA: Diagnosis not present

## 2022-11-08 DIAGNOSIS — G8114 Spastic hemiplegia affecting left nondominant side: Secondary | ICD-10-CM | POA: Diagnosis not present

## 2022-11-08 DIAGNOSIS — Z79899 Other long term (current) drug therapy: Secondary | ICD-10-CM

## 2022-11-08 DIAGNOSIS — M21379 Foot drop, unspecified foot: Secondary | ICD-10-CM

## 2022-11-08 NOTE — Progress Notes (Signed)
Received update after faxing order:     Faxed order below to [employer], attn: Trish at [phone]. Received fax confirmation.

## 2022-11-08 NOTE — Progress Notes (Signed)
[facility]  PATIENT: Meredith Hale DOB: 1961/02/09  REFERRING DOCTOR OR PCP: Dr. Merita Norton [facility] neurology) SOURCE: Patient, notes from Dr. Manson Passey, imaging and lab reports   _________________________________   HISTORICAL  CHIEF COMPLAINT:  Chief Complaint  Patient presents with   Follow-up    Pt in room 11. Here for MS follow up. Pt states she feels good. Pt interested in up right walker called U step walker. Pt would to discuss switching infusion sites due to cost, would be less expensive. Pt currently comes here for Ocrevus.    HISTORY OF PRESENT ILLNESS:  Meredith Hale is a 62 year old woman with a relapsing form of secondary progressive MS diagnosed in 2006 though she had a Lhermitte sign in 1999.    Update 11/08/2022 She is on Ocrevus last infusion 08/2022  and she tolerates it well.  She notes more difficulty with handwriting due to more left arm weakness and reduced fine motor control.   Sh had an OT evaluation earlier today.   She had wanted to have a documented assessment. .  She has not had any exacerbations while on Ocrevus.  However, she has had some progression of symptoms with worsening gait and reduced left hand strength.   She has reduced gait and uses a cane.  She has a left foot drop and a weak ankle causing pronation on the right.   She wears bilateral AFO.   Ampyra had not helped when she tried in the past.   Balance is poor.  She has two falls since last visit - one was a slip and the other was due to reduced balance.     She notes reduced fine motor skill dexterity in her dominant left hand.   Writing and preparing skills is more difficult.   The left fingers curl.   Due to the spasticity on the left side she takes baclofen and tizanidine with benefit for legs bu not the left arm..  She also has urinary hesitancy but no incontinence.      She has fatigue, worse some days than others.   Sleep is good most nights.  She notes some mild  depression.     She takes Vit D supplements.    She notes fairly mild cognitive issues including reduced word finding, short-term memory, focus/attention.  Additionally she has noted reduced ability to parallel process.  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   She stopped working in 2019 due to her progressive impairments affecting gait and fine motor control.      MS history: She was diagnosed with MS in 2006.  She was mowing the lawn on a hot day when she felt weak and dizzy.   The next morning she woke up numb from the waist down.   When she did not improve, she saw her doctor.   She had MRIs and a lumbar puncture.   The MRI showed lesions consistent with MS,   She had oligoclonal bands in her CSF.   Initially, she was placed on Betaseron but had side effects causing body aches.  Then she was placed on Copaxone and switched to Tecfidera after having an exacerbation.  She tolerated Tecfidera well but her lymphocyte count dropped and she was switched to Aubagio.  More recently she switched from Aubagio to Ocrevus to be on a more effective medication.   Her first infusion was November 2018 and her last infusion was July 2022.  Imaging  studies: January 2018 MRI of the brain and cervical spine:   The brain showed several white matter foci which could be consistent with MS.  The cervical spinal cord showed at least 5 foci c/w demyelination.     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    06/25/2018 MRI of the brain and cervical spine: The brain is unchanged.  The cervical spine is unchanged.  There is a mild Chiari malformation with 6-7 mm of left cerebellar ectopia.   The spinal cord is of normal caliber.    There are T2 hyperintense foci anteromedially adjacent to C2, posterior to the left adjacent to C2, laterally to the right adjacent to C2-C3, lateral to the left adjacent to C4 and posterolateral to the left adjacent to C4-C5.  Additionally, there are degenerative  changes at C4-C5 through C6-C7.  03/21/2019 MRI: The MRI of the brain showed similar pattern of T2/FLAIR hyperintense foci in the hemispheres.  There is 8 mm cerebellar ectopia consistent with a Chiari I malformation and increased compared to the previous MRI.  (This was performed after neurologic symptoms that followed an epidural steroid injection)  MRI of the cervical spine 04/28/2020 showed T2 hyperintense foci anteromedially adjacent to C2, posterior to the left adjacent to C2, l towards the left adjacent to C3, lateral to the left adjacent to C4 and posterolateral to the left adjacent to C4-C5.  There are minimal disc degenerative changes at C4-C5 through C6-C7 and facet hypertrophy to the right at C4-C5.  No spinal stenosis or nerve root compression.  MRI of the brain 04/28/2020 showed just a couple punctate T2/FLAIR hyperintense foci in the subcortical or deep white matter.  There is 7 mm of cerebellar ectopia without pegging and a pineal cyst.  MRI of the brain,  from 11/18/2021 performed at [facility].  The MRI of the brain showed just a few punctate T2/FLAIR hypertense foci.  These are nonspecific.  She has 6 to 7 mm cerebellar ectopia, left greater than right.     MRI of the cervical and thoracic spine 11/18/2021 was read as showing a normal spinal cord by the radiologist.  I disagree.  It showed multiple T2 hyperintense foci within the spinal cord adjacent to C2 to C4-C5.  The overall pattern appears to be unchanged compared to the MRI from 2021.  There are mild disc degenerative changes not leading to spinal stenosis or nerve root compression.   Other: She has scoliosis causing some back pain and has been told it has worsened.   NCV/EMG 02/25/2019 shows mild left ulnar neuropathy, not severe enough to explain atrophy in the left hand.  Reduced recruitment in the C7 and C8 myotomes most consistent with a chronic demyelinating plaque involving the anterior horn  cells.  Laboratory tests.  She had hepatitis lab and TB quant to Farren test 01/20/2022.  These were negative.  Vitamin D was normal 10/14/2021  REVIEW OF SYSTEMS: Constitutional: No fevers, chills, sweats, or change in appetite.  She has fatigue. Eyes: No visual changes, double vision, eye pain.  There is light sensitivity. Ear, nose and throat: No hearing loss, ear pain, nasal congestion, sore throat.  She has tinnitus. Cardiovascular: No chest pain, palpitations Respiratory:  No shortness of breath at rest or with exertion.   No wheezes GastrointestinaI: No nausea, vomiting, diarrhea, abdominal pain, fecal incontinence Genitourinary:  No dysuria, urinary retention or frequency.  No nocturia. Musculoskeletal:  No neck pain, back pain.   She  has scoliosis.   Integumentary: No rash, pruritus currently though she does have psoriasis and some skin moles Neurological: as above Psychiatric: No depression at this time.  No anxiety Endocrine: No palpitations, diaphoresis, change in appetite, change in weigh or increased thirst Hematologic/Lymphatic:  No anemia, purpura, petechiae. Allergic/Immunologic: No itchy/runny eyes, nasal congestion, recent allergic reactions, rashes  ALLERGIES: Allergies  Allergen Reactions   Colophony [Pinus Strobus]    Formaldehyde    Propylene Glycol    Quaternium-15     HOME MEDICATIONS:  Current Outpatient Medications:    AMBULATORY NON FORMULARY MEDICATION, To Acupuncturist and Orthotics Attn Douglas Leclair Right knee brace for genu recurvatum to assist stability and ambulation. Left ankle-foot orthosis for foot drop to assess stability and ambulation. Duration of need = lifelong., Disp: 1 each, Rfl: 0   baclofen (LIORESAL) 20 MG tablet, Take 1 tablet (20 mg total) by mouth 4 (four) times daily., Disp: 360 tablet, Rfl: 3   Biotin 1000 MCG tablet, Take 1,000 mcg by mouth daily. , Disp: , Rfl:    buPROPion (WELLBUTRIN XL) 300 MG 24 hr tablet, Take 1  tablet (300 mg total) by mouth daily., Disp: 90 tablet, Rfl: 3   CALCIUM-VITAMIN D PO, Take by mouth., Disp: , Rfl:    Cholecalciferol (D3-1000) 25 MCG (1000 UT) capsule, Take 1,000 Units by mouth daily., Disp: , Rfl:    Clobetasol Propionate (CLOBEX SPRAY) 0.05 % external spray, Clobex 0.05 % topical spray, Disp: , Rfl:    CLODERM 0.1 % cream, APPLY TOPICALLY TO THE AFFECTED AREA EVERY DAY, Disp: , Rfl:    fluocinonide (LIDEX) 0.05 % external solution, Apply 1 application topically as needed., Disp: , Rfl:    Multiple Vitamins-Minerals (MULTIVITAL) tablet, Take 1 tablet by mouth daily., Disp: , Rfl:    ocrelizumab 600 mg in sodium chloride 0.9 % 500 mL, Inject 600 mg into the vein every 6 (six) months., Disp: , Rfl:    Omega-3 Fatty Acids (FISH OIL) 1000 MG CAPS, Take by mouth., Disp: , Rfl:    solifenacin (VESICARE) 5 MG tablet, Take 1 tablet (5 mg total) by mouth daily., Disp: 90 tablet, Rfl: 4   tiZANidine (ZANAFLEX) 4 MG tablet, May take 1-2 tablets nightly., Disp: 180 tablet, Rfl: 3  PAST MEDICAL HISTORY: Past Medical History:  Diagnosis Date   Multiple sclerosis    Psoriasis    Scoliosis     PAST SURGICAL HISTORY: Past Surgical History:  Procedure Laterality Date   AUGMENTATION MAMMAPLASTY     bone navicular  07/05/2006   FINGER GANGLION CYST EXCISION     FINGER SURGERY  06/28/2015   capsular release due to injury   FOOT SURGERY     right and left foot   FOOT SURGERY     HEMORRHOIDECTOMY WITH HEMORRHOID BANDING  04/19/2017   LUMBAR EPIDURAL INJECTION  04/27/2017   lymph node removal  1976   REFRACTIVE SURGERY  06/02/1994   THYROID SURGERY     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    FAMILY HISTORY: Family History  Adopted: Yes  Family history unknown: Yes    SOCIAL HISTORY:  Social History   Socioeconomic History   Marital status: Single    Spouse name: Not on file   Number of children: 0   Years of education: Masters   Highest  education level: Not on file  Occupational History   Occupation: unemployed  Tobacco Use   Smoking status: Former  Packs/day: 1    Types: Cigarettes   Smokeless tobacco: Never  Vaping Use   Vaping Use: Never used  Substance and Sexual Activity   Alcohol use: Yes    Comment: 1 per week   Drug use: Never   Sexual activity: Yes    Partners: Male  Other Topics Concern   Not on file  Social History Narrative   Lives w/ significant other, Sharen Hint   Caffeine use: 2 cups per day   Left handed but transitioning to be right handed d/t being weak in left hand   Social Determinants of Corporate investment banker Strain: Not on file  Food Insecurity: Not on file  Transportation Needs: Not on file  Physical Activity: Not on file  Stress: Not on file  Social Connections: Not on file  Intimate Partner Violence: Not on file     PHYSICAL EXAM  Vitals:   11/08/22 1303  BP: 127/78  Pulse: 65  Weight: 175 lb (79.4 kg)  Height: 5\' 7"  (1.702 m)     Body mass index is 27.41 kg/m.   General: The patient is well-developed and well-nourished and in no acute distress   Neck: The neck is supple, no carotid bruits are noted.  She has mild tenderness midline at the skull base   Skin: Extremities are without rash or edema.  Neurologic Exam  Mental status: The patient is alert and oriented x 3 at the time of the examination. The patient has apparent normal recent and remote memory, with an apparently normal attention span and concentration ability.   Speech is normal.  Cranial nerves: Extraocular movements are full.   There is good facial sensation to soft touch bilaterally.Facial strength is normal.  Trapezius and sternocleidomastoid strength is normal. No dysarthria is noted. No obvious hearing deficits are noted.  Motor:  There is some increased muscle tone in the left leg.. Strength is  5 / 5 on the right arm and 4+/5 right leg.   Strength is 4/5 in the left arm except 2+ to  3/5 in the intrinsic hand muscles.  She has atrophy of the intrinsic hand muscles..  Strength is 2-/5 in the left iliopsoas, 2+/5 in the left quadriceps, 3/5 in the left hamstrings, 2-/5 last ankle extension  Sensory: She has intact sensation to touch sensation.  She has reduced sensation to vibration in the left arm and leg relative to the right.  Coordination: Cerebellar testing reveals reduced left finger-nose-finger and she can't do left heel-to-shin .  She has mildly reduced right heel-to-shin.  Gait and station: Station is normal.  She is wearing AFO braces.  She has left leg weakness noted on the gait with foot drop.  She is unable to walk more than a few feet without the use of her cane or support..  She is unable to do a tandem walk.  The Romberg is borderline..   Reflexes: Deep tendon reflexes are symmetric and increased on the left relative to the right.  She has a left extensor plantar response ______________________________________________________  ASSESSMENT AND PLAN  1. Multiple sclerosis   2. Left spastic hemiplegia   3. Gait abnormality   4. Other fatigue   5. Alteration in cognition       1.   Continue Ocrevus.   She may want to change to a different infusion center and will let us know.    2.   She is permanently disabled due to a combination of physical (weakness, spasticity  and reduced coordination of dominant left side) and cognitive issues (fatigue, reduced ability to multitask, memory disturbance) related to her MS.  Her EDSS is 6.0 even wearing AFO brace. 3.   Renew baclofen, Solifenacin, tizanidine, Wellbutrin XL 300 mg.   4.   Prescription for new AFO braces in for an upright walker.  She will return to see me in 6 months but sooner if there are new or worsening neurologic symptoms.    Shelma Eiben A. Epimenio Foot, MD, PhD, FAAN Certified in Neurology, Clinical Neurophysiology, Sleep Medicine, Pain Medicine and Neuroimaging Director, [facility] at [facility]  [facility] [address], [address] [place], [place] [address] [phone]

## 2022-11-09 LAB — CBC WITH DIFFERENTIAL/PLATELET
Basophils Absolute: 0.1 10*3/uL (ref 0.0–0.2)
Basos: 1 %
EOS (ABSOLUTE): 0.2 10*3/uL (ref 0.0–0.4)
Eos: 3 %
Hematocrit: 38.9 % (ref 34.0–46.6)
Hemoglobin: 13 g/dL (ref 11.1–15.9)
Immature Grans (Abs): 0 10*3/uL (ref 0.0–0.1)
Immature Granulocytes: 0 %
Lymphocytes Absolute: 1.1 10*3/uL (ref 0.7–3.1)
Lymphs: 18 %
MCH: 29.9 pg (ref 26.6–33.0)
MCHC: 33.4 g/dL (ref 31.5–35.7)
MCV: 89 fL (ref 79–97)
Monocytes Absolute: 0.6 10*3/uL (ref 0.1–0.9)
Monocytes: 10 %
Neutrophils Absolute: 4.2 10*3/uL (ref 1.4–7.0)
Neutrophils: 68 %
Platelets: 319 10*3/uL (ref 150–450)
RBC: 4.35 x10E6/uL (ref 3.77–5.28)
RDW: 12.6 % (ref 11.7–15.4)
WBC: 6 10*3/uL (ref 3.4–10.8)

## 2022-11-09 LAB — IGG, IGA, IGM
IgA/Immunoglobulin A, Serum: 262 mg/dL (ref 87–352)
IgG (Immunoglobin G), Serum: 871 mg/dL (ref 586–1602)
IgM (Immunoglobulin M), Srm: 58 mg/dL (ref 26–217)

## 2022-12-28 ENCOUNTER — Encounter: Payer: Self-pay | Admitting: Neurology

## 2022-12-28 NOTE — Telephone Encounter (Signed)
Faxed referral to [facility]) at [phone]. Received fax confirmation.  Informed intrafusion that pt will be making switch, they cx upcoming appt.

## 2023-01-22 ENCOUNTER — Encounter: Payer: Self-pay | Admitting: Neurology

## 2023-01-22 ENCOUNTER — Other Ambulatory Visit: Payer: Self-pay | Admitting: Neurology

## 2023-01-22 DIAGNOSIS — M419 Scoliosis, unspecified: Secondary | ICD-10-CM | POA: Insufficient documentation

## 2023-01-22 DIAGNOSIS — M545 Low back pain, unspecified: Secondary | ICD-10-CM | POA: Insufficient documentation

## 2023-01-23 NOTE — Progress Notes (Signed)
Order for upright walker. Faxed to [facility] [phone].

## 2023-02-13 NOTE — Telephone Encounter (Signed)
Received fax from [facility] [phone], fax [phone] for the upright walker, needing face sheet and OV. Faxed to them 16 pags ([employer]. Insurance, OV).  Received fax confirmation received.

## 2023-04-27 ENCOUNTER — Other Ambulatory Visit: Payer: Self-pay | Admitting: Neurology

## 2023-04-30 ENCOUNTER — Encounter: Payer: Self-pay | Admitting: Family Medicine

## 2023-04-30 ENCOUNTER — Other Ambulatory Visit: Payer: Self-pay | Admitting: *Deleted

## 2023-04-30 DIAGNOSIS — G35 Multiple sclerosis: Secondary | ICD-10-CM

## 2023-04-30 NOTE — Telephone Encounter (Signed)
Last seen on 11/08/22 Follow up scheduled on 05/16/23

## 2023-05-02 ENCOUNTER — Telehealth: Payer: Self-pay | Admitting: Neurology

## 2023-05-02 NOTE — Telephone Encounter (Signed)
[employer] [id number] exp. 05/02/23-07/01/23 sent to GI [phone]

## 2023-05-15 NOTE — Progress Notes (Unsigned)
No chief complaint on file.   HISTORY OF PRESENT ILLNESS:  05/15/23 ALL:  Meredith Hale is a 62 y.o. female here today for follow up for SPMS. She continues Ocrevus ([facility] in [place]). Labs were stable 10/2022. MRI was ordered 04/2023 and scheduled 05/22/2023.   Upright walker  AFO  Tizanidine 4-8mg  QHS  Bupropion 300mg  daily   Solifenacin 5mg  daily   HISTORY (copied from Dr Bonnita Hollow previous note)  Meredith Hale is a 62 year old woman with a relapsing form of secondary progressive MS diagnosed in 2006 though she had a Lhermitte sign in 1999.     Update 11/08/2022 She is on Ocrevus last infusion 08/2022  and she tolerates it well.  She notes more difficulty with handwriting due to more left arm weakness and reduced fine motor control.   Sh had an OT evaluation earlier today.   She had wanted to have a documented assessment. .  She has not had any exacerbations while on Ocrevus.  However, she has had some progression of symptoms with worsening gait and reduced left hand strength.    She has reduced gait and uses a cane.  She has a left foot drop and a weak ankle causing pronation on the right.   She wears bilateral AFO.   Ampyra had not helped when she tried in the past.   Balance is poor.  She has two falls since last visit - one was a slip and the other was due to reduced balance.     She notes reduced fine motor skill dexterity in her dominant left hand.   Writing and preparing skills is more difficult.   The left fingers curl.   Due to the spasticity on the left side she takes baclofen and tizanidine with benefit for legs bu not the left arm..  She also has urinary hesitancy but no incontinence.       She has fatigue, worse some days than others.   Sleep is good most nights.  She notes some mild depression.     She takes Vit D supplements.     She notes fairly mild cognitive issues including reduced word finding, short-term memory, focus/attention.  Additionally she has noted  reduced ability to parallel process.   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   She stopped working in 2019 due to her progressive impairments affecting gait and fine motor control.       MS history: She was diagnosed with MS in 2006.  She was mowing the lawn on a hot day when she felt weak and dizzy.   The next morning she woke up numb from the waist down.   When she did not improve, she saw her doctor.   She had MRIs and a lumbar puncture.   The MRI showed lesions consistent with MS,   She had oligoclonal bands in her CSF.   Initially, she was placed on Betaseron but had side effects causing body aches.  Then she was placed on Copaxone and switched to Tecfidera after having an exacerbation.  She tolerated Tecfidera well but her lymphocyte count dropped and she was switched to Aubagio.  More recently she switched from Aubagio to Ocrevus to be on a more effective medication.   Her first infusion was November 2018 and her last infusion was July 2022.   Imaging studies: January 2018 MRI of the brain and cervical spine:   The brain showed several white matter foci  which could be consistent with MS.  The cervical spinal cord showed at least 5 foci c/w demyelination.     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     06/25/2018 MRI of the brain and cervical spine: The brain is unchanged.  The cervical spine is unchanged.  There is a mild Chiari malformation with 6-7 mm of left cerebellar ectopia.   The spinal cord is of normal caliber.    There are T2 hyperintense foci anteromedially adjacent to C2, posterior to the left adjacent to C2, laterally to the right adjacent to C2-C3, lateral to the left adjacent to C4 and posterolateral to the left adjacent to C4-C5.  Additionally, there are degenerative changes at C4-C5 through C6-C7.   03/21/2019 MRI: The MRI of the brain showed similar pattern of T2/FLAIR hyperintense foci in the hemispheres.  There is 8 mm cerebellar ectopia  consistent with a Chiari I malformation and increased compared to the previous MRI.  (This was performed after neurologic symptoms that followed an epidural steroid injection)   MRI of the cervical spine 04/28/2020 showed T2 hyperintense foci anteromedially adjacent to C2, posterior to the left adjacent to C2, l towards the left adjacent to C3, lateral to the left adjacent to C4 and posterolateral to the left adjacent to C4-C5.  There are minimal disc degenerative changes at C4-C5 through C6-C7 and facet hypertrophy to the right at C4-C5.  No spinal stenosis or nerve root compression.   MRI of the brain 04/28/2020 showed just a couple punctate T2/FLAIR hyperintense foci in the subcortical or deep white matter.  There is 7 mm of cerebellar ectopia without pegging and a pineal cyst.   MRI of the brain,  from 11/18/2021 performed at [facility].  The MRI of the brain showed just a few punctate T2/FLAIR hypertense foci.  These are nonspecific.  She has 6 to 7 mm cerebellar ectopia, left greater than right.      MRI of the cervical and thoracic spine 11/18/2021 was read as showing a normal spinal cord by the radiologist.  I disagree.  It showed multiple T2 hyperintense foci within the spinal cord adjacent to C2 to C4-C5.  The overall pattern appears to be unchanged compared to the MRI from 2021.  There are mild disc degenerative changes not leading to spinal stenosis or nerve root compression.   Other: She has scoliosis causing some back pain and has been told it has worsened.   NCV/EMG 02/25/2019 shows mild left ulnar neuropathy, not severe enough to explain atrophy in the left hand.  Reduced recruitment in the C7 and C8 myotomes most consistent with a chronic demyelinating plaque involving the anterior horn cells.   Laboratory tests.  She had hepatitis lab and TB quant to Farren test 01/20/2022.  These were negative.  Vitamin D was normal 10/14/2021   REVIEW OF SYSTEMS: Out of a complete 14  system review of symptoms, the patient complains only of the following symptoms, and all other reviewed systems are negative.   ALLERGIES: Allergies  Allergen Reactions   Colophony [Pinus Strobus]    Formaldehyde    Propylene Glycol    Quaternium-15      HOME MEDICATIONS: Outpatient Medications Prior to Visit  Medication Sig Dispense Refill   AMBULATORY NON FORMULARY MEDICATION To [facility] Attn Douglas Leclair Right knee brace for genu recurvatum to assist stability and ambulation. Left ankle-foot orthosis for foot drop to assess stability and ambulation. Duration of  need = lifelong. 1 each 0   baclofen (LIORESAL) 20 MG tablet TAKE 1 TABLET FOUR TIMES DAILY 360 tablet 0   Biotin 1000 MCG tablet Take 1,000 mcg by mouth daily.      buPROPion (WELLBUTRIN XL) 300 MG 24 hr tablet TAKE 1 TABLET EVERY DAY 90 tablet 0   CALCIUM-VITAMIN D PO Take by mouth.     Cholecalciferol (D3-1000) 25 MCG (1000 UT) capsule Take 1,000 Units by mouth daily.     Clobetasol Propionate (CLOBEX SPRAY) 0.05 % external spray Clobex 0.05 % topical spray     CLODERM 0.1 % cream APPLY TOPICALLY TO THE AFFECTED AREA EVERY DAY     fluocinonide (LIDEX) 0.05 % external solution Apply 1 application topically as needed.     Multiple Vitamins-Minerals (MULTIVITAL) tablet Take 1 tablet by mouth daily.     ocrelizumab 600 mg in sodium chloride 0.9 % 500 mL Inject 600 mg into the vein every 6 (six) months.     Omega-3 Fatty Acids (FISH OIL) 1000 MG CAPS Take by mouth.     solifenacin (VESICARE) 5 MG tablet Take 1 tablet (5 mg total) by mouth daily. 90 tablet 4   tiZANidine (ZANAFLEX) 4 MG tablet TAKE 1 TO 2 TABLETS EVERY NIGHT 180 tablet 0   No facility-administered medications prior to visit.     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     FAMILY HISTORY: Family History  Adopted: Yes  Family history unknown: Yes     SOCIAL HISTORY: Social History   Socioeconomic History   Marital status: Single    Spouse name: Not on file   Number of children: 0   Years of education: Masters   Highest education level: Not on file  Occupational History   Occupation: unemployed  Tobacco Use   Smoking status: Former    Current packs/day: 1.00    Types: Cigarettes   Smokeless tobacco: Never  Vaping Use   Vaping status: Never Used  Substance and Sexual Activity   Alcohol use: Yes    Comment: 1 per week   Drug use: Never   Sexual activity: Yes    Partners: Male  Other Topics Concern   Not on file  Social History Narrative   Lives w/ significant other, Sharen Hint   Caffeine use: 2 cups per day   Left handed but transitioning to be right handed d/t being weak in left hand   Social Determinants of Health   Financial Resource Strain: Not on file  Food Insecurity: Not on file  Transportation Needs: Not on file  Physical Activity: Not on file  Stress: Not on file  Social Connections: Unknown (01/26/2023)   [facility]   Social Network  Social Network: Not on file  Intimate Partner Violence: Unknown (01/26/2023)   Received from [facility]   HITS    Physically Hurt: Not on file    Insult or Talk Down To: Not on file    Threaten Physical Harm: Not on file    Scream or Curse: Not on file     PHYSICAL EXAM  There were no vitals filed for  this visit. There is no height or weight on file to calculate BMI.  Generalized: Well developed, in no acute distress  Cardiology: normal rate and rhythm, no murmur auscultated  Respiratory: clear to auscultation bilaterally    Neurological examination  Mentation: Alert oriented to time, place, history taking. Follows all commands speech and language fluent Cranial nerve II-XII: Pupils were equal round reactive to light. Extraocular movements were full, visual field were full on confrontational test. Facial sensation and strength were normal. Uvula tongue midline. Head turning and shoulder shrug  were normal and symmetric. Motor: The motor testing reveals 5 over 5 strength of all 4 extremities. Good symmetric motor tone is noted throughout.  Sensory: Sensory testing is intact to soft touch on all 4 extremities. No evidence of extinction is noted.  Coordination: Cerebellar testing reveals good finger-nose-finger and heel-to-shin bilaterally.  Gait and station: Gait is normal. Tandem gait is normal. Romberg is negative. No drift is seen.  Reflexes: Deep tendon reflexes are symmetric and normal bilaterally.    DIAGNOSTIC DATA (LABS, IMAGING, TESTING) - I reviewed patient records, labs, notes, testing and imaging myself where available.  Lab Results  Component Value Date   WBC 6.0 11/08/2022   HGB 13.0 11/08/2022   HCT 38.9 11/08/2022   MCV 89 11/08/2022   PLT 319 11/08/2022      Component Value Date/Time   NA 141 02/12/2020 1458   K 4.3 02/12/2020 1458   CL 104 02/12/2020 1458   CO2 26 02/12/2020 1458   GLUCOSE 98 02/12/2020 1458   BUN 17 02/12/2020 1458   CREATININE 0.83 02/12/2020 1458   CALCIUM 9.5 02/12/2020 1458   PROT 7.4 04/21/2020 1658   ALBUMIN 4.3 04/21/2020 1658   AST 37 04/21/2020 1658   ALT 67 (H) 04/21/2020 1658   ALKPHOS 112 04/21/2020 1658   BILITOT 0.3 04/21/2020 1658   GFRNONAA 79 02/12/2020 1458   GFRAA 91 02/12/2020 1458   Lab Results  Component Value  Date   CHOL 219 (H) 01/07/2020   HDL 53 01/07/2020   LDLCALC 132 (H) 01/07/2020   TRIG 190 (H) 01/07/2020   CHOLHDL 4.1 01/07/2020   No results found for: "HGBA1C" No results found for: "VITAMINB12" Lab Results  Component Value Date   TSH 1.81 02/12/2020        No data to display               No data to display           ASSESSMENT AND PLAN  62 y.o. year old female  has a past medical history of Multiple sclerosis (HCC), Psoriasis, and Scoliosis. here with    No diagnosis found.  Meredith Hale ***.  Healthy lifestyle habits encouraged. *** will follow up with PCP as directed. *** will return to see me in ***, sooner if needed. *** verbalizes understanding and agreement with this plan.   No orders of the defined types were placed in this encounter.    No orders of the defined types were placed in this encounter.    Corion Sherrod,  MSN, FNP-C 05/15/2023, 12:33 PM  [facility] [address] [place], [place] [address] [phone]

## 2023-05-15 NOTE — Patient Instructions (Incomplete)

## 2023-05-16 ENCOUNTER — Ambulatory Visit (INDEPENDENT_AMBULATORY_CARE_PROVIDER_SITE_OTHER): Payer: Medicare HMO | Admitting: Family Medicine

## 2023-05-16 ENCOUNTER — Encounter: Payer: Self-pay | Admitting: Family Medicine

## 2023-05-16 VITALS — BP 127/83 | HR 73 | Ht 67.0 in | Wt 169.0 lb

## 2023-05-16 DIAGNOSIS — R5383 Other fatigue: Secondary | ICD-10-CM

## 2023-05-16 DIAGNOSIS — G35 Multiple sclerosis: Secondary | ICD-10-CM | POA: Diagnosis not present

## 2023-05-16 DIAGNOSIS — G8114 Spastic hemiplegia affecting left nondominant side: Secondary | ICD-10-CM | POA: Diagnosis not present

## 2023-05-16 DIAGNOSIS — R269 Unspecified abnormalities of gait and mobility: Secondary | ICD-10-CM

## 2023-05-16 DIAGNOSIS — M21379 Foot drop, unspecified foot: Secondary | ICD-10-CM

## 2023-05-16 DIAGNOSIS — Z79899 Other long term (current) drug therapy: Secondary | ICD-10-CM

## 2023-05-16 MED ORDER — SOLIFENACIN SUCCINATE 5 MG PO TABS: 5.0000 mg | ORAL_TABLET | Freq: Every day | ORAL | 3 refills | Status: DC

## 2023-05-16 MED ORDER — BUPROPION HCL ER (XL) 150 MG PO TB24
150.0000 mg | ORAL_TABLET | Freq: Every day | ORAL | 3 refills | Status: DC
Start: 2023-05-16 — End: 2024-05-22

## 2023-05-17 LAB — CBC WITH DIFFERENTIAL/PLATELET
Basophils Absolute: 0.1 10*3/uL (ref 0.0–0.2)
Basos: 1 %
EOS (ABSOLUTE): 0.1 10*3/uL (ref 0.0–0.4)
Eos: 2 %
Hematocrit: 40.5 % (ref 34.0–46.6)
Hemoglobin: 12.5 g/dL (ref 11.1–15.9)
Immature Grans (Abs): 0 10*3/uL (ref 0.0–0.1)
Immature Granulocytes: 0 %
Lymphocytes Absolute: 1.1 10*3/uL (ref 0.7–3.1)
Lymphs: 21 %
MCH: 29.1 pg (ref 26.6–33.0)
MCHC: 30.9 g/dL — ABNORMAL LOW (ref 31.5–35.7)
MCV: 94 fL (ref 79–97)
Monocytes Absolute: 0.6 10*3/uL (ref 0.1–0.9)
Monocytes: 10 %
Neutrophils Absolute: 3.6 10*3/uL (ref 1.4–7.0)
Neutrophils: 66 %
Platelets: 343 10*3/uL (ref 150–450)
RBC: 4.29 x10E6/uL (ref 3.77–5.28)
RDW: 12.8 % (ref 11.7–15.4)
WBC: 5.4 10*3/uL (ref 3.4–10.8)

## 2023-05-17 LAB — IGG, IGA, IGM
IgA/Immunoglobulin A, Serum: 264 mg/dL (ref 87–352)
IgG (Immunoglobin G), Serum: 863 mg/dL (ref 586–1602)
IgM (Immunoglobulin M), Srm: 56 mg/dL (ref 26–217)

## 2023-05-22 ENCOUNTER — Ambulatory Visit
Admission: RE | Admit: 2023-05-22 | Discharge: 2023-05-22 | Disposition: A | Discharge status: Home / Self Care | Payer: Self-pay | Source: Ambulatory Visit | Attending: Neurology | Admitting: Neurology

## 2023-05-22 DIAGNOSIS — G35 Multiple sclerosis: Secondary | ICD-10-CM

## 2023-05-22 MED ORDER — GADOPICLENOL 0.5 MMOL/ML IV SOLN
7.5000 mL | Freq: Once | INTRAVENOUS | Status: AC | PRN
  Administered 2023-05-22: 7.5 mL via INTRAVENOUS

## 2023-07-07 ENCOUNTER — Other Ambulatory Visit: Payer: Self-pay | Admitting: Neurology

## 2023-07-09 NOTE — Telephone Encounter (Signed)
Last seen on 05/16/23 Follow up scheduled on 10/16/23

## 2023-09-06 ENCOUNTER — Telehealth: Payer: Self-pay | Admitting: *Deleted

## 2023-09-06 NOTE — Telephone Encounter (Signed)
 Marland Kitchen

## 2023-09-25 ENCOUNTER — Other Ambulatory Visit: Payer: Self-pay

## 2023-09-25 MED ORDER — BACLOFEN 20 MG PO TABS: 20.0000 mg | ORAL_TABLET | Freq: Four times a day (QID) | ORAL | 0 refills | Status: DC

## 2023-09-25 MED ORDER — TIZANIDINE HCL 4 MG PO TABS: ORAL_TABLET | ORAL | 0 refills | Status: DC

## 2023-10-16 ENCOUNTER — Ambulatory Visit (INDEPENDENT_AMBULATORY_CARE_PROVIDER_SITE_OTHER): Payer: Medicare HMO | Admitting: Neurology

## 2023-10-16 ENCOUNTER — Encounter: Payer: Self-pay | Admitting: Neurology

## 2023-10-16 VITALS — BP 120/75 | HR 75 | Ht 67.0 in | Wt 163.5 lb

## 2023-10-16 DIAGNOSIS — M21379 Foot drop, unspecified foot: Secondary | ICD-10-CM | POA: Diagnosis not present

## 2023-10-16 DIAGNOSIS — R269 Unspecified abnormalities of gait and mobility: Secondary | ICD-10-CM

## 2023-10-16 DIAGNOSIS — R5383 Other fatigue: Secondary | ICD-10-CM

## 2023-10-16 DIAGNOSIS — G35 Multiple sclerosis: Secondary | ICD-10-CM | POA: Diagnosis not present

## 2023-10-16 DIAGNOSIS — G8114 Spastic hemiplegia affecting left nondominant side: Secondary | ICD-10-CM

## 2023-10-16 DIAGNOSIS — G935 Compression of brain: Secondary | ICD-10-CM

## 2023-10-16 NOTE — Progress Notes (Signed)
 [facility]  PATIENT: Meredith Hale DOB: [birth year]  REFERRING DOCTOR OR PCP: Dr. Merita Norton [facility] neurology) SOURCE: Patient, notes from Dr. Manson Passey, imaging and lab reports   _________________________________   HISTORICAL  CHIEF COMPLAINT:  Chief Complaint  Patient presents with   Follow-up    Pt in room 11.alone. Here for MS follow up. DMT: Emogene Morgan. Pt said MS standpoint she is stable, more weakness in left hand, no recent falls.     HISTORY OF PRESENT ILLNESS:  Meredith Hale is a 63 year old woman with a relapsing form of secondary progressive MS diagnosed in 2006 though she had a Lhermitte sign in 1999.    Update 10/16/2023 She had a severe headache in the occiput lasting 4 days.  There were some jabs of pain behind her ears.   She took Tylenol without benefit.   She had trouble laying flat so slept upright.   She notes that she was looking up for a while before the headahce.  She has a mild Chiari malformation.    She has only had a few headaxches like this over her life.   Only minimal N and no V.   No photophobia.    She is on Ocrevus last infusion 08/2023  and she tolerates it well.  She denies any exacerbation.  She does note that symptoms on the left, especially left hand coordination, have progressed some over the last couple of years.  She did OT a few years ago and felt it may have helped She had wanted to have a documented assessment. .  She has not had any exacerbations while on Ocrevus.  However, she has had some progression of symptoms with worsening gait and reduced left hand strength.   Gait is doing worse but she notes needing knee surgery (sees Dr. Jewel Baize).   She had a gel injection into the knee today and will b getting a custom brace. If that is not helpful, then surgery may be needed.    She has a left > right foot drop and uses bilat AFO (was just left)   Ampyra had not helped when she tried in the past.   Balance is poor.    She notes  reduced fine motor skill dexterity in her dominant left hand.   Writing and preparing skills is more difficult.   The left fingers curl up at times.   Due to the spasticity on the left side she takes baclofen and tizanidine with benefit for legs bu not the left arm..  She also has urinary hesitancy but no incontinence.      She has fatigue, worse some days than others.   Sleep is not always consistent.   She denies much depression.     She notes stable and fairly mild cognitive issues at times including reduced word finding, short-term memory, focus/attention.  Additionally she has noted reduced ability to parallel process.  She takes Vit D supplements.    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   She stopped working in 2019 due to her progressive impairments affecting gait and fine motor control.      MS history: She was diagnosed with MS in 2006.  She was mowing the lawn on a hot day when she felt weak and dizzy.   The next morning she woke up numb from the waist down.   When she did not improve, she saw her doctor.   She had MRIs and a  lumbar puncture.   The MRI showed lesions consistent with MS,   She had oligoclonal bands in her CSF.   Initially, she was placed on Betaseron but had side effects causing body aches.  Then she was placed on Copaxone and switched to Tecfidera after having an exacerbation.  She tolerated Tecfidera well but her lymphocyte count dropped and she was switched to Aubagio.  More recently she switched from Aubagio to Ocrevus to be on a more effective medication.   Her first infusion was November 2018 and her last infusion was July 2022.  Imaging studies: January 2018 MRI of the brain and cervical spine:   The brain showed several white matter foci which could be consistent with MS.  The cervical spinal cord showed at least 5 foci c/w demyelination.     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    06/25/2018 MRI of the brain and cervical spine:  The brain is unchanged.  The cervical spine is unchanged.  There is a mild Chiari malformation with 6-7 mm of left cerebellar ectopia.   The spinal cord is of normal caliber.    There are T2 hyperintense foci anteromedially adjacent to C2, posterior to the left adjacent to C2, laterally to the right adjacent to C2-C3, lateral to the left adjacent to C4 and posterolateral to the left adjacent to C4-C5.  Additionally, there are degenerative changes at C4-C5 through C6-C7.  03/21/2019 MRI: The MRI of the brain showed similar pattern of T2/FLAIR hyperintense foci in the hemispheres.  There is 8 mm cerebellar ectopia consistent with a Chiari I malformation and increased compared to the previous MRI.  (This was performed after neurologic symptoms that followed an epidural steroid injection)  MRI of the cervical spine 04/28/2020 showed T2 hyperintense foci anteromedially adjacent to C2, posterior to the left adjacent to C2, l towards the left adjacent to C3, lateral to the left adjacent to C4 and posterolateral to the left adjacent to C4-C5.  There are minimal disc degenerative changes at C4-C5 through C6-C7 and facet hypertrophy to the right at C4-C5.  No spinal stenosis or nerve root compression.  MRI of the brain 04/28/2020 showed just a couple punctate T2/FLAIR hyperintense foci in the subcortical or deep white matter.  There is 7 mm of cerebellar ectopia without pegging and a pineal cyst.  MRI of the brain,  from 11/18/2021 performed at [facility].  The MRI of the brain showed just a few punctate T2/FLAIR hypertense foci.  These are nonspecific.  She has 6 to 7 mm cerebellar ectopia, left greater than right.     MRI of the cervical and thoracic spine 11/18/2021 was read as showing a normal spinal cord by the radiologist.  I disagree.  It showed multiple T2 hyperintense foci within the spinal cord adjacent to C2 to C4-C5.  The overall pattern appears to be unchanged compared to the MRI from 2021.   There are mild disc degenerative changes not leading to spinal stenosis or nerve root compression.  MRI brain and cervical spine   Other: She has scoliosis causing some back pain and has been told it has worsened.   NCV/EMG 02/25/2019 shows mild left ulnar neuropathy, not severe enough to explain atrophy in the left hand.  Reduced recruitment in the C7 and C8 myotomes most consistent with a chronic demyelinating plaque involving the anterior horn cells.  Laboratory tests.  She had hepatitis lab and TB quant to Farren test 01/20/2022.  These  were negative.  Vitamin D was normal 10/14/2021  REVIEW OF SYSTEMS: Constitutional: No fevers, chills, sweats, or change in appetite.  She has fatigue. Eyes: No visual changes, double vision, eye pain.  There is light sensitivity. Ear, nose and throat: No hearing loss, ear pain, nasal congestion, sore throat.  She has tinnitus. Cardiovascular: No chest pain, palpitations Respiratory:  No shortness of breath at rest or with exertion.   No wheezes GastrointestinaI: No nausea, vomiting, diarrhea, abdominal pain, fecal incontinence Genitourinary:  No dysuria, urinary retention or frequency.  No nocturia. Musculoskeletal:  No neck pain, back pain.   She has scoliosis.   Integumentary: No rash, pruritus currently though she does have psoriasis and some skin moles Neurological: as above Psychiatric: No depression at this time.  No anxiety Endocrine: No palpitations, diaphoresis, change in appetite, change in weigh or increased thirst Hematologic/Lymphatic:  No anemia, purpura, petechiae. Allergic/Immunologic: No itchy/runny eyes, nasal congestion, recent allergic reactions, rashes  ALLERGIES: Allergies  Allergen Reactions   Colophony [Pinus Strobus]    Formaldehyde    Propylene Glycol    Quaternium-15     HOME MEDICATIONS:  Current Outpatient Medications:    AMBULATORY NON FORMULARY MEDICATION, To Acupuncturist and Orthotics Attn Douglas Leclair  Right knee brace for genu recurvatum to assist stability and ambulation. Left ankle-foot orthosis for foot drop to assess stability and ambulation. Duration of need = lifelong., Disp: 1 each, Rfl: 0   baclofen (LIORESAL) 20 MG tablet, Take 1 tablet (20 mg total) by mouth 4 (four) times daily., Disp: 360 tablet, Rfl: 0   Biotin 1000 MCG tablet, Take 1,000 mcg by mouth daily. , Disp: , Rfl:    buPROPion (WELLBUTRIN XL) 150 MG 24 hr tablet, Take 1 tablet (150 mg total) by mouth daily., Disp: 90 tablet, Rfl: 3   CALCIUM-VITAMIN D PO, Take by mouth., Disp: , Rfl:    Cholecalciferol (D3-1000) 25 MCG (1000 UT) capsule, Take 1,000 Units by mouth daily., Disp: , Rfl:    Clobetasol Prop Emollient Base 0.05 % emollient cream, Apply 0.5 g topically 2 (two) times daily., Disp: , Rfl:    fluocinonide (LIDEX) 0.05 % external solution, Apply 1 application topically as needed., Disp: , Rfl:    Multiple Vitamins-Minerals (MULTIVITAL) tablet, Take 1 tablet by mouth daily., Disp: , Rfl:    ocrelizumab 600 mg in sodium chloride 0.9 % 500 mL, Inject 600 mg into the vein every 6 (six) months., Disp: , Rfl:    Omega-3 Fatty Acids (FISH OIL) 1000 MG CAPS, Take by mouth., Disp: , Rfl:    solifenacin (VESICARE) 5 MG tablet, Take 1 tablet (5 mg total) by mouth daily., Disp: 90 tablet, Rfl: 3   tiZANidine (ZANAFLEX) 4 MG tablet, TAKE 1 TO 2 TABLETS EVERY NIGHT, Disp: 180 tablet, Rfl: 0   Clobetasol Propionate (CLOBEX SPRAY) 0.05 % external spray, Clobex 0.05 % topical spray, Disp: , Rfl:   PAST MEDICAL HISTORY: Past Medical History:  Diagnosis Date   Multiple sclerosis (HCC)    Psoriasis    Scoliosis     PAST SURGICAL HISTORY: Past Surgical History:  Procedure Laterality Date   AUGMENTATION MAMMAPLASTY     bone navicular  07/05/2006   FINGER GANGLION CYST EXCISION     FINGER SURGERY  06/28/2015   capsular release due to injury   FOOT SURGERY     right and left foot   FOOT SURGERY     HEMORRHOIDECTOMY WITH  HEMORRHOID BANDING  04/19/2017   LUMBAR  EPIDURAL INJECTION  04/27/2017   lymph node removal  1976   REFRACTIVE SURGERY  06/02/1994   THYROID SURGERY     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    FAMILY HISTORY: Family History  Adopted: Yes  Family history unknown: Yes    SOCIAL HISTORY:  Social History   Socioeconomic History   Marital status: Single    Spouse name: Not on file   Number of children: 0   Years of education: Masters   Highest education level: Not on file  Occupational History   Occupation: unemployed  Tobacco Use   Smoking status: Former    Current packs/day: 1.00    Types: Cigarettes   Smokeless tobacco: Never  Vaping Use   Vaping status: Never Used  Substance and Sexual Activity   Alcohol use: Yes    Comment: 1 per week   Drug use: Never   Sexual activity: Yes    Partners: Male  Other Topics Concern   Not on file  Social History Narrative   Lives w/ significant other, Sharen Hint   Caffeine use: 2 cups per day   Left handed but transitioning to be right handed d/t being weak in left hand   Social Drivers of Health   Financial Resource Strain: Low Risk  (09/28/2023)   Received from [facility]   Overall Financial Resource Strain (CARDIA)    Difficulty of Paying Living Expenses: Not hard at all  Food Insecurity: No Food Insecurity (09/28/2023)   Received from [facility]   Hunger Vital Sign    Worried About Running Out of Food in the Last Year: Never true    Ran Out of Food in the Last Year: Never true  Transportation Needs: No Transportation Needs (09/28/2023)   Received from [facility] - Transportation    Lack of Transportation (Medical): No    Lack of Transportation (Non-Medical): No  Physical Activity: Insufficiently Active (09/28/2023)   [facility]   Exercise Vital Sign    Days of Exercise per Week: 2 days    Minutes of Exercise per Session: 30 min  Stress: No Stress Concern  Present (09/28/2023)   Received from [facility] - Occupational Stress Questionnaire    Feeling of Stress : Not at all  Social Connections: Moderately Integrated (09/28/2023)   Received from [facility]   Social Network    How would you rate your social network (family, work, friends)?: Adequate participation with social networks  Intimate Partner Violence: Not At Risk (09/28/2023)   Received from [facility]   HITS    Over the last 12 months how often did your partner physically hurt you?: Never    Over the last 12 months how often did your partner insult you or talk down to you?: Never    Over the last 12 months how often did your partner threaten you with physical harm?: Never    Over the last 12 months how often did your partner scream or curse at you?: Never     PHYSICAL EXAM  Vitals:   10/16/23 1429  BP: 120/75  Pulse: 75  Weight: 163 lb 8 oz (74.2 kg)  Height: 5\' 7"  (1.702 m)     Body mass index is 25.61 kg/m.   General: The patient is well-developed and well-nourished and in no acute distress   Neck: The neck is supple, no carotid bruits are noted.  She has mild tenderness midline at the skull base   Skin: Extremities are without rash or edema.  Neurologic Exam  Mental status: The patient is alert and oriented x 3 at the time of the examination. The patient has apparent normal recent and remote memory, with an apparently normal attention span and concentration ability.   Speech is normal.  Cranial nerves: Extraocular movements are full.   There is good facial sensation to soft touch bilaterally.Facial strength is normal.  Trapezius and sternocleidomastoid strength is normal. No dysarthria is noted. No obvious hearing deficits are noted.  Motor:  There is some increased muscle tone in the left leg.. Strength is  5 / 5 in the right arm and 4+/5 right leg.   Strength is 4/5 in the left arm except 2+ to 3/5 in the  intrinsic hand muscles.  She has atrophy of the intrinsic hand muscles..  Strength is 2-/5 in the left iliopsoas, 2+/5 in the left quadriceps, 3/5 in the left hamstrings, 2-/5 last ankle extension.  Sensory: She has intact sensation to touch sensation.  She has reduced sensation to vibration in the left arm and leg relative to the right.  Coordination: She has reduced left finger-nose-finger and is unable to do heel-to-shin on the left..  She has mildly reduced right heel-to-shin.  Gait and station: Station is normal.  She is wearing AFO braces.  She has left leg weakness noted on the gait with foot drop.  She is unable to walk more than a few feet without the use of her cane or support..  She is unable to do a tandem walk.  The Romberg is borderline..   Reflexes: Deep tendon reflexes are symmetric and increased on the left relative to the right.  She has a left extensor plantar response ______________________________________________________  ASSESSMENT AND PLAN  1. Multiple sclerosis (HCC)   2. Other fatigue   3. Foot-drop, unspecified laterality   4. Gait abnormality   5. Left spastic hemiplegia (HCC)   6. Chiari malformation type I (HCC)       1.   Continue Ocrevus.    Last infusion was in mid February 2025.  Around the time of the next visit will need to check IgG/IgM and CBC/Diff.   Can recheck MRI in 2026. 2.   She is permanently disabled due to a combination of physical (weakness, spasticity and reduced coordination of dominant left side) and cognitive issues (fatigue, reduced ability to multitask, memory disturbance) related to her MS.  Her EDSS is 6.0 even wearing AFO brace. 3.   Renew baclofen, Solifenacin, tizanidine, Wellbutrin XL 300 mg.   4.  She will e out of town x 1 month but consider occupational therapy for left hand when she returns.  She will return to see me in 6 months but sooner if there are new or worsening neurologic symptoms.  This visit is part of a  comprehensive longitudinal care medical relationship regarding the patients primary diagnosis of MS and related concerns.    Caesar Mannella A. Epimenio Foot, MD, PhD, FAAN Certified in Neurology, Clinical Neurophysiology, Sleep Medicine, Pain Medicine and Neuroimaging Director, Multiple Sclerosis Center at [facility]  [facility] [address] [place], [place] [address] [phone]

## 2023-12-12 ENCOUNTER — Other Ambulatory Visit: Payer: Self-pay | Admitting: *Deleted

## 2023-12-12 MED ORDER — BACLOFEN 20 MG PO TABS: 20.0000 mg | ORAL_TABLET | Freq: Four times a day (QID) | ORAL | 1 refills | Status: DC

## 2023-12-12 NOTE — Telephone Encounter (Signed)
 Last seen on 10/16/23 Follow up scheduled on 05/22/24

## 2023-12-13 ENCOUNTER — Other Ambulatory Visit: Payer: Self-pay

## 2023-12-18 ENCOUNTER — Other Ambulatory Visit: Payer: Self-pay | Admitting: *Deleted

## 2023-12-18 MED ORDER — TIZANIDINE HCL 4 MG PO TABS: ORAL_TABLET | ORAL | 1 refills | Status: DC

## 2023-12-18 NOTE — Telephone Encounter (Signed)
 Last seen on 10/16/23 per note " Renew baclofen , Solifenacin , tizanidine , Wellbutrin  XL 300 mg. " Follow up scheduled on 05/22/24

## 2024-02-29 ENCOUNTER — Telehealth: Payer: Self-pay | Admitting: Neurology

## 2024-02-29 NOTE — Telephone Encounter (Signed)
 Pt received infusion through [facility]. She is due for next infusion on Tuesday 03/04/24. The nurse with [facility] is needing a new updated script before the infusion on Tuesday. Advised I would pass this along to the staff and we will have Dr Vear sign on Monday. The script will need to be faxed to [phone].

## 2024-03-03 NOTE — Telephone Encounter (Signed)
 Updated order filled out, pending MD signature

## 2024-03-03 NOTE — Telephone Encounter (Signed)
 Faxed updated order, received fax confirmation.

## 2024-03-05 NOTE — Telephone Encounter (Signed)
 Meredith Hale

## 2024-04-11 ENCOUNTER — Encounter: Payer: Self-pay | Admitting: Neurology

## 2024-04-14 ENCOUNTER — Other Ambulatory Visit: Payer: Self-pay | Admitting: Neurology

## 2024-04-14 DIAGNOSIS — G935 Compression of brain: Secondary | ICD-10-CM

## 2024-04-14 DIAGNOSIS — G8114 Spastic hemiplegia affecting left nondominant side: Secondary | ICD-10-CM

## 2024-04-15 NOTE — Telephone Encounter (Signed)
 MRIs sent to [facility] [phone]

## 2024-04-20 ENCOUNTER — Encounter: Payer: Self-pay | Admitting: Neurology

## 2024-04-24 ENCOUNTER — Encounter: Payer: Self-pay | Admitting: Neurology

## 2024-05-01 ENCOUNTER — Other Ambulatory Visit: Payer: Self-pay | Admitting: *Deleted

## 2024-05-01 MED ORDER — SOLIFENACIN SUCCINATE 5 MG PO TABS: 5.0000 mg | ORAL_TABLET | Freq: Every day | ORAL | 0 refills | Status: DC

## 2024-05-01 NOTE — Telephone Encounter (Signed)
 Last seen on 10/16/23 Follow up scheduled on 05/22/24

## 2024-05-11 ENCOUNTER — Ambulatory Visit
Admission: RE | Admit: 2024-05-11 | Discharge: 2024-05-11 | Disposition: A | Source: Ambulatory Visit | Attending: Neurology | Admitting: Neurology

## 2024-05-11 DIAGNOSIS — G935 Compression of brain: Secondary | ICD-10-CM

## 2024-05-11 DIAGNOSIS — G35D Multiple sclerosis, unspecified: Secondary | ICD-10-CM

## 2024-05-11 DIAGNOSIS — G8114 Spastic hemiplegia affecting left nondominant side: Secondary | ICD-10-CM

## 2024-05-11 DIAGNOSIS — G939 Disorder of brain, unspecified: Secondary | ICD-10-CM | POA: Diagnosis not present

## 2024-05-11 MED ORDER — GADOPICLENOL 0.5 MMOL/ML IV SOLN
7.0000 mL | Freq: Once | INTRAVENOUS | Status: AC | PRN
  Administered 2024-05-11: 7 mL via INTRAVENOUS

## 2024-05-12 ENCOUNTER — Other Ambulatory Visit: Payer: Self-pay | Admitting: Neurology

## 2024-05-12 DIAGNOSIS — E890 Postprocedural hypothyroidism: Secondary | ICD-10-CM

## 2024-05-12 DIAGNOSIS — E041 Nontoxic single thyroid nodule: Secondary | ICD-10-CM

## 2024-05-12 NOTE — Progress Notes (Signed)
 Physical Therapy Daily Treatment  Patient Name:  Meredith Hale Date of Birth:  November 06, 1960 Today's Date:  May 12, 2024 Referred by:  Gwynne Burgess, MD  Visit #: 11 of     Precautions:   fall risk, multiple sclerosis, scoliosis, R TKR   Assessment/Plan   Assessment & Plan   Assessment  Assessment details: Focus on hip strengthening this date as this is a limited factor with standing and mobility tasks. Discussed the role of hip stability in maintaining postural control and balance. Due to fatigability, found most success with breaking up strengthening tasks into sets of 5 with rest breaks. Good success with sidelying gravity reduced hip flexion strengthening to reduce compensatory movement. Will continue with this in addition to balance training.   The patient's current status relative to goals: progress is being made toward all goals except: 5x STS goal. Any goals not currently met are due to need to continue with POC. Although the patient has demonstrated improvements due to the interventions and therapy provided, they have yet to attain maximal improvement. It is anticipated that improvement will occur within 8 weeks. Perceived quality of life: Fair.:  Has there been a change in where the patient lives or who the patient lives with since initial evaluation: No Does the beneficiary require this outpatient therapy plan of care in order to return to a premorbid (or reside in a new) living environment? no            Plan: Complete FGA STS, gait training, hip/core strengthening          Continual Care Information Refer to clinical note for plan details.    Subjective/History   Subjective Evaluation    History of Present Illness   Subjective/Mechanism of injury: Today she is fatigued. SKC and AFOs have been ordered, AFOs should be here Nov. 11. SKC should get here possibly within the next week.   Pertinent medical history:  The patient presents today for initial  follow up visit. FGA completed indicating fall risk and profound dynamic gait deficits. Trial of SKC with significant improvement in L Knee recurvatum and pt reports feeling much better with improved knee stability - will continue to communicate with orthotist to discuss bracing options of AFO + SKC vs lightweight KAFO for maximum knee stability. Will perform at next visit and discuss progress aerobic capacity program with intervals as pt reports a goal of going to [facility] to watch fireworks on July 4th next year. Will continue to focus on core and hip strengthening to provide stable foundation for standing and gait tasks. Pain  No pain reported    Objective  There were no vitals filed for this visit.  Objective   Patient Specific Functional Score    Activity 1 Score: 2 (walk with normal gait pattern) Activity 2 Score: 5 (step over low objects without tripping) PSFS Score: 3.5   Standardized Testing    Interventions Performed    Anything left blank was deferred at this time During all sensitive area treatments, patient provided continual verbal consent before and during each intervention.  Remote Therapeutic Monitoring:   Therapeutic Ex [phone]):   NuStep intervals lvl 4 1 min on SPM>75 1 min off SPM 50s STS no UE support 22.5 mat slow eccentrics  Sidelying hip flexion with frictionless sheet x10 x5 Bridges blue peanut ball 5x3 Hamstring curls blue peanut ball 5x3   Therapeutic Act (02469):      Manual (02859):    Neuro Re-Ed (  02887):     Gait Training (02883):    Canalith Repositioning (04007):    Education:  The patient was educated regarding: therapy diagnosis, anatomy and physiology, treatment plan, rationale, therapeutic goals, expectations, and home exercise program. All patient questions were entertained and answered today.  The education provided was required by a skilled therapist through active lecturing and/or demonstration and the time spent  was included in the appropriate/correlative billed codes.    HEP Provided:  Access Code: [email or web address] URL: [email or web address] Date: 04/30/2024 Prepared by: Ricard Pinal  Exercises - Supine Heel Slide  - 2 x daily - 7 x weekly - 10 reps - Supine Ankle Pumps  - 2 x daily - 7 x weekly - 20-30 reps - Standing March with Counter Support  - 2 x daily - 7 x weekly - 8-10 reps - Mini Squat with Counter Support  - 2 x daily - 7 x weekly - 8-10 reps - Supine Bridge  - 1 x daily - 7 x weekly - 2 sets - 10 reps - Seated Clamshell with Resistance  - 1 x daily - 7 x weekly - 2 sets - 15 reps - Standing Tandem Balance with Counter Support  - 1 x daily - 7 x weekly - 2-3 sets - 30-60 second hold - Side Stepping with Counter Support  - 1 x daily - 7 x weekly - 2-3 sets - 10-15 reps - Backward Walking with Counter Support  - 1 x daily - 7 x weekly - 2-3 sets - 10-15 reps  Skilled PT required for exercise performance to ensure proper technique for continued safe & effective progression.  Each CPT code is separate and distinct therefore medically necessary.   Goals & Plan of Care   The patient will demonstrate independence with the HEP to improve performance of functional mobility. Patient will increase left LE strength in hip muscle group/s to >=3/5 to indicate an improvement in their ability to perform functional mobility including transfers, balance, and ambulation. 05/08/24  goal met Pt will improve PSFS score from 3.5 at initial evaluation by >6 in order to demonstrate functional improvement. The patient will increase their comfortable gait speed to >=0.65m/s as measured by the in order to demonstrate improved community mobility. 05/08/24 0.50 m/s progress The patient will improve their time on the 5 time sit to stand to <=15 seconds with armrest in order to demonstrate a decrease in the risk of falling and improved functional transfers. 05/08/24 20.49 sec slight decline New goal  04/10/24 The patient will increase their score on the FGA to >=12/30 in order to demonstrate a decrease in their risk of falling. Pateint will improve backward walking speed to 0.2 m/s to indicate improving backward stepping speed to reduce risk of falling. 05/08/24 0.16 m/s progress New goal 05/08/24 Patient will increase left LE strength in hip muscle group/s to >=4/5 to indicate an improvement in their ability to perform functional mobility including transfers, balance, and ambulation.   Treatment Time   Today's Evaluation/Treatment: 1635  1715 Total Time: 40 min Certification Period: 04/07/24 to 06/30/24  Date for PT Re-Evaluation: 06/30/24 or by visit #20  Charges   Total Time Code Treatment Minutes: 40  PT Therapeutic Time Entry Therapeutic Exercise Time Entry: 40   Ricard Pinal, PT 05/12/2024 5:47 PM  [facility] [address] [place] [address] Dept: [phone] Dept Fax: [phone]

## 2024-05-14 ENCOUNTER — Other Ambulatory Visit: Payer: Self-pay | Admitting: Neurology

## 2024-05-15 ENCOUNTER — Ambulatory Visit

## 2024-05-15 DIAGNOSIS — E049 Nontoxic goiter, unspecified: Secondary | ICD-10-CM | POA: Diagnosis not present

## 2024-05-15 DIAGNOSIS — E041 Nontoxic single thyroid nodule: Secondary | ICD-10-CM

## 2024-05-15 DIAGNOSIS — E890 Postprocedural hypothyroidism: Secondary | ICD-10-CM

## 2024-05-15 NOTE — Telephone Encounter (Signed)
 Last seen on 10/16/23 Follow up scheduled on 05/22/24

## 2024-05-20 ENCOUNTER — Ambulatory Visit: Payer: Self-pay | Admitting: Neurology

## 2024-05-22 ENCOUNTER — Encounter: Payer: Self-pay | Admitting: Neurology

## 2024-05-22 ENCOUNTER — Ambulatory Visit (INDEPENDENT_AMBULATORY_CARE_PROVIDER_SITE_OTHER): Admitting: Neurology

## 2024-05-22 VITALS — BP 118/76 | HR 75 | Ht 66.0 in | Wt 163.0 lb

## 2024-05-22 DIAGNOSIS — R269 Unspecified abnormalities of gait and mobility: Secondary | ICD-10-CM

## 2024-05-22 DIAGNOSIS — G35C1 Active secondary progressive multiple sclerosis: Secondary | ICD-10-CM | POA: Diagnosis not present

## 2024-05-22 DIAGNOSIS — E041 Nontoxic single thyroid nodule: Secondary | ICD-10-CM

## 2024-05-22 DIAGNOSIS — G8114 Spastic hemiplegia affecting left nondominant side: Secondary | ICD-10-CM | POA: Diagnosis not present

## 2024-05-22 DIAGNOSIS — Z79899 Other long term (current) drug therapy: Secondary | ICD-10-CM | POA: Diagnosis not present

## 2024-05-22 DIAGNOSIS — E890 Postprocedural hypothyroidism: Secondary | ICD-10-CM

## 2024-05-22 NOTE — Progress Notes (Signed)
 [facility]  PATIENT: Meredith Hale DOB: [birth year]  REFERRING DOCTOR OR PCP: Dr. Nira [facility]  neurology) SOURCE: Patient, notes from Dr. Delores, imaging and lab reports   _________________________________   HISTORICAL  CHIEF COMPLAINT:  Chief Complaint  Patient presents with   RM     Patient is here for a MS follow-up - no concerns ( read about weight loss drug have potential benefit of MS)     HISTORY OF PRESENT ILLNESS:  Meredith Hale is a 63year old woman with an active form of secondary progressive MS  Update 05/22/2024  She is on Ocrevus  last infusion 03/2024.  She tolerates it well.   She denies any exacerbation.  We discussed the natural history of MS new medications might provide better benefit for secondary progressive MS.  Recent MRI 05/11/2024 showed no new lesions.  The MRI of the cervical spine did show multiple left thyroid  nodules and she is status post right thyroidectomy.  Ultrasound of the thyroid  showed the surgical changes and there was some heterogenous echogenic soft tissue in the resection bed.  The nodules in the left hemithyroid were felt to be benign  Gait is poor due to leg weakness and reduced balance.  She hyperextends the knee.  She had recent knee surgery and her recovery was good.     She has a left > right foot drop and uses bilat AFO ( getting new ones with a sewedish knee cage)t)   Ampyra had not helped when she tried in the past.   She notes reduced fine motor skill dexterity in her dominant left hand.   Writing and other tasks are more difficult.   The left fingers curl up at times and she wears a splint at night.  .   Due to the spasticity on the left side she takes baclofen  and tizanidine  .  She tolerates this well.  She has had some vertigo that occurs with head flat laying down.    She also has urinary hesitancy but no incontinence.      Vision is fine. She never had ON  She notes some fatigue.   Sleep is not  always consistent.     Mood is doing well.   She has stopped Wellbutrin .    She notes stable and fairly mild cognitive issues at times including reduced word finding, short-term memory, focus/attention.     She takes Vit D supplements.    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   She stopped working in 2019 due to her progressive impairments affecting gait and fine motor control.      MS history: She was diagnosed with MS in 2006.  She was mowing the lawn on a hot day when she felt weak and dizzy.   The next morning she woke up numb from the waist down.   When she did not improve, she saw her doctor.   She had MRIs and a lumbar puncture.   The MRI showed lesions consistent with MS,   She had oligoclonal bands in her CSF.   Initially, she was placed on Betaseron but had side effects causing body aches.  Then she was placed on Copaxone and switched to Tecfidera after having an exacerbation.  She tolerated Tecfidera well but her lymphocyte count dropped and she was switched to Aubagio.  More recently she switched from Aubagio to Ocrevus  to be on a more effective medication.   Her first infusion was November 2018  and her last infusion was July 2022.  Imaging studies: January 2018 MRI of the brain and cervical spine:   The brain showed several white matter foci which could be consistent with MS.  The cervical spinal cord showed at least 5 foci c/w demyelination.     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    06/25/2018 MRI of the brain and cervical spine: The brain is unchanged.  The cervical spine is unchanged.  There is a mild Chiari malformation with 6-7 mm of left cerebellar ectopia.   The spinal cord is of normal caliber.    There are T2 hyperintense foci anteromedially adjacent to C2, posterior to the left adjacent to C2, laterally to the right adjacent to C2-C3, lateral to the left adjacent to C4 and posterolateral to the left adjacent to C4-C5.  Additionally, there  are degenerative changes at C4-C5 through C6-C7.  03/21/2019 MRI: The MRI of the brain showed similar pattern of T2/FLAIR hyperintense foci in the hemispheres.  There is 8 mm cerebellar ectopia consistent with a Chiari I malformation and increased compared to the previous MRI.  (This was performed after neurologic symptoms that followed an epidural steroid injection)  MRI of the cervical spine 04/28/2020 showed T2 hyperintense foci anteromedially adjacent to C2, posterior to the left adjacent to C2, l towards the left adjacent to C3, lateral to the left adjacent to C4 and posterolateral to the left adjacent to C4-C5.  There are minimal disc degenerative changes at C4-C5 through C6-C7 and facet hypertrophy to the right at C4-C5.  No spinal stenosis or nerve root compression.  MRI of the brain 04/28/2020 showed just a couple punctate T2/FLAIR hyperintense foci in the subcortical or deep white matter.  There is 7 mm of cerebellar ectopia without pegging and a pineal cyst.  MRI of the brain,  from 11/18/2021 performed at [facility].  The MRI of the brain showed just a few punctate T2/FLAIR hypertense foci.  These are nonspecific.  She has 6 to 7 mm cerebellar ectopia, left greater than right.     MRI of the cervical and thoracic spine 11/18/2021 was read as showing a normal spinal cord by the radiologist.  I disagree.  It showed multiple T2 hyperintense foci within the spinal cord adjacent to C2 to C4-C5.  The overall pattern appears to be unchanged compared to the MRI from 2021.  There are mild disc degenerative changes not leading to spinal stenosis or nerve root compression.  MRI brain and cervical spine   Other: She has scoliosis causing some back pain and has been told it has worsened.   NCV/EMG 02/25/2019 shows mild left ulnar neuropathy, not severe enough to explain atrophy in the left hand.  Reduced recruitment in the C7 and C8 myotomes most consistent with a chronic demyelinating  plaque involving the anterior horn cells.  Laboratory tests.  She had hepatitis lab and TB quant to Farren test 01/20/2022.  These were negative.  Vitamin D  was normal 10/14/2021  REVIEW OF SYSTEMS: Constitutional: No fevers, chills, sweats, or change in appetite.  She has fatigue. Eyes: No visual changes, double vision, eye pain.  There is light sensitivity. Ear, nose and throat: No hearing loss, ear pain, nasal congestion, sore throat.  She has tinnitus. Cardiovascular: No chest pain, palpitations Respiratory:  No shortness of breath at rest or with exertion.   No wheezes GastrointestinaI: No nausea, vomiting, diarrhea, abdominal pain, fecal incontinence Genitourinary:  No dysuria, urinary retention  or frequency.  No nocturia. Musculoskeletal:  No neck pain, back pain.   She has scoliosis.   Integumentary: No rash, pruritus currently though she does have psoriasis and some skin moles Neurological: as above Psychiatric: No depression at this time.  No anxiety Endocrine: No palpitations, diaphoresis, change in appetite, change in weigh or increased thirst Hematologic/Lymphatic:  No anemia, purpura, petechiae. Allergic/Immunologic: No itchy/runny eyes, nasal congestion, recent allergic reactions, rashes  ALLERGIES: Allergies  Allergen Reactions   Colophony [Pinus Strobus]    Formaldehyde    Propylene Glycol    Quaternium-15     HOME MEDICATIONS:  Current Outpatient Medications:    baclofen  (LIORESAL ) 20 MG tablet, TAKE 1 TABLET FOUR TIMES DAILY, Disp: 360 tablet, Rfl: 0   Biotin 1000 MCG tablet, Take 1,000 mcg by mouth daily. , Disp: , Rfl:    CALCIUM-VITAMIN D  PO, Take by mouth., Disp: , Rfl:    Cholecalciferol (D3-1000) 25 MCG (1000 UT) capsule, Take 1,000 Units by mouth daily., Disp: , Rfl:    Clobetasol Prop Emollient Base 0.05 % emollient cream, Apply 0.5 g topically 2 (two) times daily., Disp: , Rfl:    fluocinonide (LIDEX) 0.05 % external solution, Apply 1 application  topically as needed., Disp: , Rfl:    Multiple Vitamins-Minerals (MULTIVITAL) tablet, Take 1 tablet by mouth daily., Disp: , Rfl:    ocrelizumab  600 mg in sodium chloride  0.9 % 500 mL, Inject 600 mg into the vein every 6 (six) months., Disp: , Rfl:    Omega-3 Fatty Acids (FISH OIL) 1000 MG CAPS, Take by mouth., Disp: , Rfl:    solifenacin  (VESICARE ) 5 MG tablet, Take 1 tablet (5 mg total) by mouth daily., Disp: 90 tablet, Rfl: 0   tiZANidine  (ZANAFLEX ) 4 MG tablet, TAKE 1 TO 2 TABLETS EVERY NIGHT, Disp: 180 tablet, Rfl: 0  PAST MEDICAL HISTORY: Past Medical History:  Diagnosis Date   Multiple sclerosis    Psoriasis    Scoliosis     PAST SURGICAL HISTORY: Past Surgical History:  Procedure Laterality Date   AUGMENTATION MAMMAPLASTY     bone navicular  07/05/2006   FINGER GANGLION CYST EXCISION     FINGER SURGERY  06/28/2015   capsular release due to injury   FOOT SURGERY     right and left foot   FOOT SURGERY     HEMORRHOIDECTOMY WITH HEMORRHOID BANDING  04/19/2017   LUMBAR EPIDURAL INJECTION  04/27/2017   lymph node removal  1976   REFRACTIVE SURGERY  06/02/1994   REPLACEMENT TOTAL KNEE Right    THYROID  SURGERY     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    FAMILY HISTORY: Family History  Adopted: Yes  Problem Relation Age of Onset   Migraines Neg Hx    Stroke Neg Hx    Seizures Neg Hx     SOCIAL HISTORY:  Social History   Socioeconomic History   Marital status: Single    Spouse name: Not on file   Number of children: 0   Years of education: Masters   Highest education level: Not on file  Occupational History   Occupation: unemployed  Tobacco Use   Smoking status: Former    Current packs/day: 1.00    Types: Cigarettes   Smokeless tobacco: Never  Vaping Use   Vaping status: Never Used  Substance and Sexual Activity   Alcohol use: Yes    Comment: 1 per week   Drug use: Never   Sexual activity:  Yes    Partners: Male  Other Topics  Concern   Not on file  Social History Narrative   Lives w/ significant other, Franky Severe   Caffeine  use: 2 cups per day   Left handed but transitioning to be right handed d/t being weak in left hand   Social Drivers of Health   Financial Resource Strain: Low Risk  (03/26/2024)   Received from [facility]   Overall Financial Resource Strain (CARDIA)    How hard is it for you to pay for the very basics like food, housing, medical care, and heating?: Not hard at all  Food Insecurity: No Food Insecurity (03/26/2024)   Received from [facility]   Hunger Vital Sign    Within the past 12 months, you worried that your food would run out before you got the money to buy more.: Never true    Within the past 12 months, the food you bought just didn't last and you didn't have money to get more.: Never true  Transportation Needs: No Transportation Needs (03/26/2024)   Received from [facility] - Transportation    In the past 12 months, has lack of transportation kept you from medical appointments or from getting medications?: No    In the past 12 months, has lack of transportation kept you from meetings, work, or from getting things needed for daily living?: No  Physical Activity: Inactive (03/26/2024)   Received from [facility]   Exercise Vital Sign    On average, how many days per week do you engage in moderate to strenuous exercise (like a brisk walk)?: 0 days    Minutes of Exercise per Session: Not on file  Stress: No Stress Concern Present (03/26/2024)   Received from [facility] - Occupational Stress Questionnaire    Do you feel stress - tense, restless, nervous, or anxious, or unable to sleep at night because your mind is troubled all the time - these days?: Not at all  Social Connections: Moderately Integrated (03/26/2024)   Received from [facility]   Social Network    How would you rate your social network (family, work,  friends)?: Adequate participation with social networks  Intimate Partner Violence: Not At Risk (03/26/2024)   Received from [facility]   HITS    Over the last 12 months how often did your partner physically hurt you?: Never    Over the last 12 months how often did your partner insult you or talk down to you?: Never    Over the last 12 months how often did your partner threaten you with physical harm?: Never    Over the last 12 months how often did your partner scream or curse at you?: Never     PHYSICAL EXAM  Vitals:   05/22/24 1456  BP: 118/76  Pulse: 75  Weight: 163 lb (73.9 kg)  Height: 5' 6 (1.676 m)     Body mass index is 26.31 kg/m.   General: The patient is well-developed and well-nourished and in no acute distress   Neck: The neck is supple, no carotid bruits are noted.  She has mild tenderness midline at the skull base   Skin: Extremities are without rash or edema.  Neurologic Exam  Mental status: The patient is alert and oriented x 3 at the time of the examination. The patient has apparent normal recent and remote memory, with an apparently normal attention span and concentration ability.  Speech is normal.  Cranial nerves: Extraocular movements are full.   There is good facial sensation to soft touch bilaterally.Facial strength is normal.  Trapezius and sternocleidomastoid strength is normal. No dysarthria is noted. No obvious hearing deficits are noted.  Motor:  There is some increased muscle tone in the left leg.. Strength is  5 / 5 in the right arm and 4+/5 right leg.   Strength is 4/5 in the left arm except 2+ to 3/5 in the intrinsic hand muscles.  She has atrophy of the intrinsic hand muscles..  Strength is 2-/5 in the left iliopsoas, 2+/5 in the left quadriceps, 3/5 in the left hamstrings, 2-/5 last ankle extension.  Sensory: She has intact sensation to touch sensation.  She has reduced sensation to vibration in the left arm and leg relative to the  right.  Coordination: She has reduced left finger-nose-finger and is unable to do heel-to-shin on the left..  She has mildly reduced right heel-to-shin.  Gait and station: Station is normal.  She is wearing AFO braces.  She can take some steps without her cane.  She is unable to do a tandem walk.  The Romberg is borderline..   Reflexes: Deep tendon reflexes are symmetric and increased on the left relative to the right.  She has a left extensor plantar response ______________________________________________________  ASSESSMENT AND PLAN  1. Active secondary progressive multiple sclerosis   2. Left spastic hemiplegia (HCC)   3. Gait abnormality   4. High risk medication use   5. Thyroid  nodule   6. H/O partial thyroidectomy        1.   Continue Ocrevus .    Last infusion was in mid September 2025.  We will recheck the IgG/IgM.   2.   She is permanently disabled due to a combination of physical (weakness, spasticity and reduced coordination of dominant left side) and cognitive issues (fatigue, reduced ability to multitask, memory disturbance) related to her MS.  Her EDSS is 6.0 even wearing AFO brace. 3.   Continued/renew baclofen , Solifenacin , tizanidine ,   4.  Advised to follow-up with the doctor managing her thyroid  issues due to the ultrasound findings. She will return to see me in 6 months but sooner if there are new or worsening neurologic symptoms.  This visit is part of a comprehensive longitudinal care medical relationship regarding the patients primary diagnosis of MS and related concerns.    Osmel Dykstra A. Vear, MD, PhD, FAAN Certified in Neurology, Clinical Neurophysiology, Sleep Medicine, Pain Medicine and Neuroimaging Director, Multiple Sclerosis [facility] at [facility]  [facility] [address] [place], [place] [address] [phone]

## 2024-05-23 ENCOUNTER — Ambulatory Visit: Payer: Self-pay | Admitting: Neurology

## 2024-05-23 LAB — IGG, IGA, IGM
IgG (Immunoglobin G), Serum: 892 mg/dL (ref 586–1602)
IgM (Immunoglobulin M), Srm: 55 mg/dL (ref 26–217)
Immunoglobulin A, (IgA) QN, Serum: 230 mg/dL (ref 87–352)

## 2024-05-26 ENCOUNTER — Encounter: Payer: Self-pay | Admitting: Neurology

## 2024-07-18 ENCOUNTER — Other Ambulatory Visit: Payer: Self-pay | Admitting: Neurology

## 2024-08-15 ENCOUNTER — Telehealth: Payer: Self-pay | Admitting: Neurology

## 2024-08-15 NOTE — Telephone Encounter (Signed)
 Meredith Hale with [employer] came by the office this morning stating she needs up to date clinical notes for the patient   Her phone number is [phone]  Fax number [phone]

## 2024-08-15 NOTE — Telephone Encounter (Signed)
 Faxed to Lyn at [facility]. last OV and lab.  Received confirmation.
